# Patient Record
Sex: Female | Born: 1955 | Race: White | Hispanic: No | Smoking: Former smoker
Health system: Southern US, Community
[De-identification: ages and names within clinical notes are randomized; demographics above are authoritative.]

## PROBLEM LIST (undated history)

## (undated) DIAGNOSIS — I639 Cerebral infarction, unspecified: Secondary | ICD-10-CM

## (undated) DIAGNOSIS — N390 Urinary tract infection, site not specified: Secondary | ICD-10-CM

## (undated) DIAGNOSIS — M109 Gout, unspecified: Secondary | ICD-10-CM

## (undated) DIAGNOSIS — F0390 Unspecified dementia without behavioral disturbance: Secondary | ICD-10-CM

## (undated) DIAGNOSIS — G2581 Restless legs syndrome: Secondary | ICD-10-CM

## (undated) DIAGNOSIS — I10 Essential (primary) hypertension: Secondary | ICD-10-CM

## (undated) DIAGNOSIS — I609 Nontraumatic subarachnoid hemorrhage, unspecified: Secondary | ICD-10-CM

## (undated) DIAGNOSIS — F1011 Alcohol abuse, in remission: Secondary | ICD-10-CM

## (undated) DIAGNOSIS — I4891 Unspecified atrial fibrillation: Secondary | ICD-10-CM

## (undated) DIAGNOSIS — Z720 Tobacco use: Secondary | ICD-10-CM

## (undated) DIAGNOSIS — I482 Chronic atrial fibrillation, unspecified: Secondary | ICD-10-CM

## (undated) DIAGNOSIS — I671 Cerebral aneurysm, nonruptured: Secondary | ICD-10-CM

## (undated) HISTORY — PX: CEREBRAL ANEURYSM REPAIR: SHX164

---

## 2011-02-16 ENCOUNTER — Inpatient Hospital Stay: Payer: Self-pay | Admitting: Internal Medicine

## 2012-08-22 IMAGING — US US CAROTID DUPLEX BILAT
1 series · 17 of 24 positions shown · non-contrast
Comparison: none

REASON FOR EXAM: CVA
COMMENTS:

PROCEDURE:     US  - US CAROTID DOPPLER BILATERAL  - February 16, 2011  [DATE]
RESULT:
Grayscale, Duplex, color flow and SPECTRAL waveform imaging was performed of
the right and left carotid systems.

[Series 1: us carotid duplex bilat · 17 of 64 slices shown]
[im 1/64]
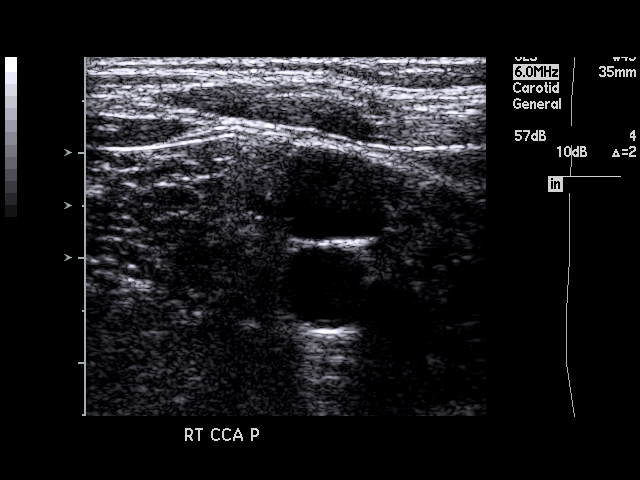
[im 6/64]
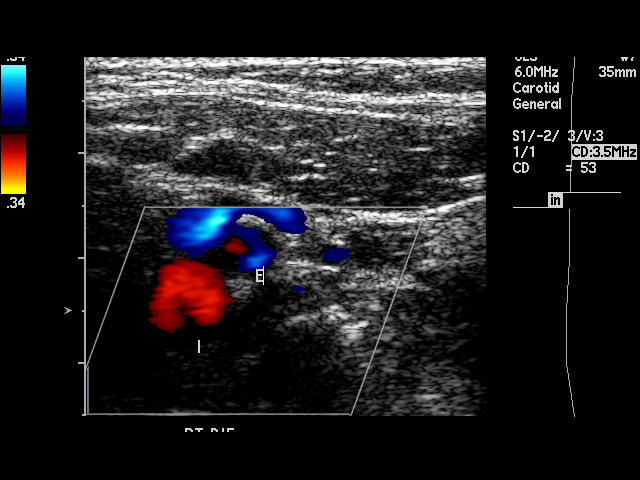
[im 9/64]
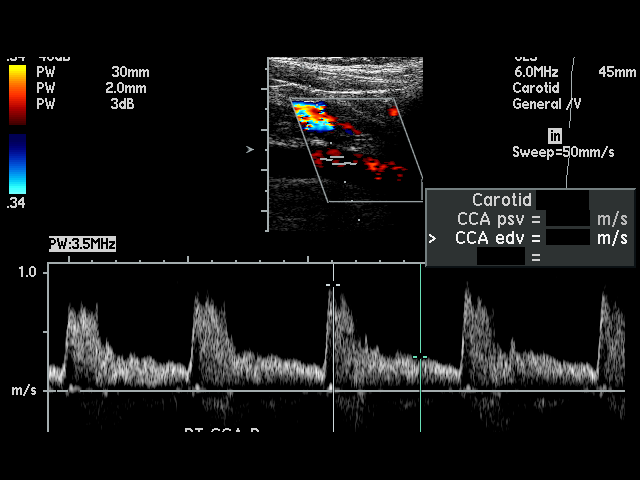
[im 11/64]
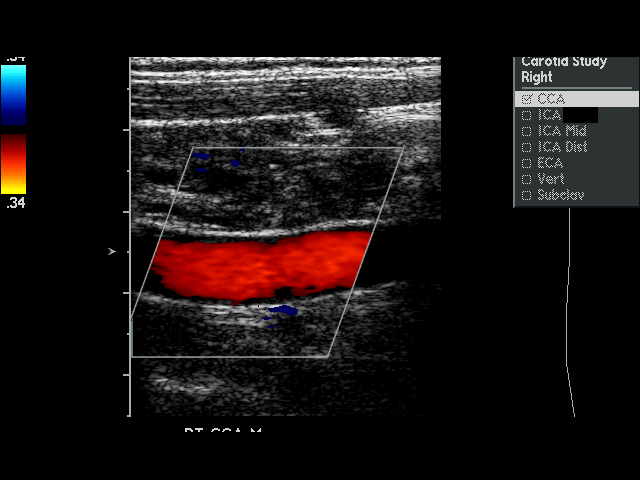
[im 17/64]
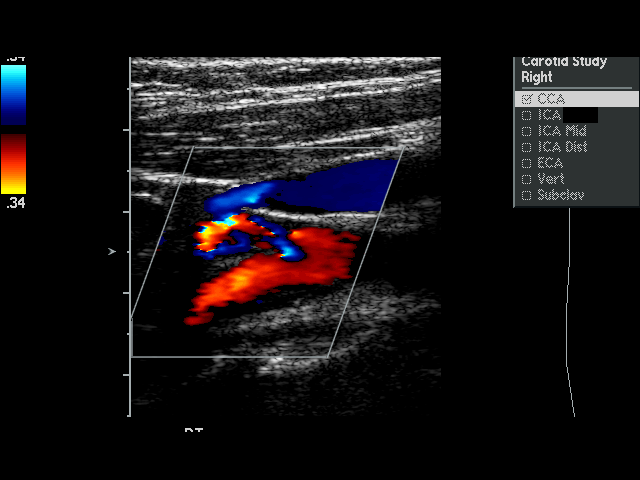
[im 20/64]
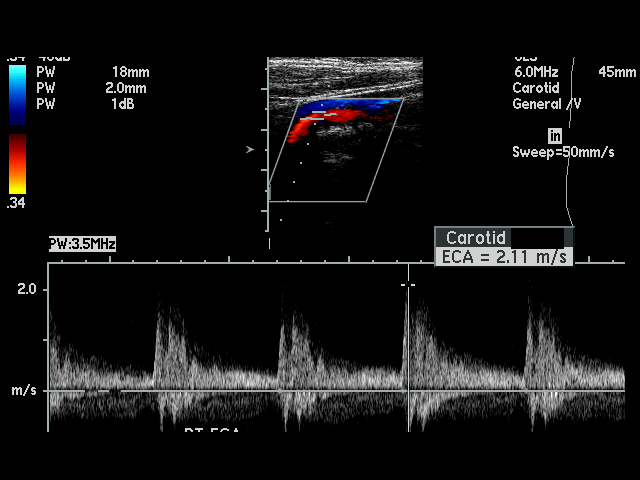
[im 25/64]
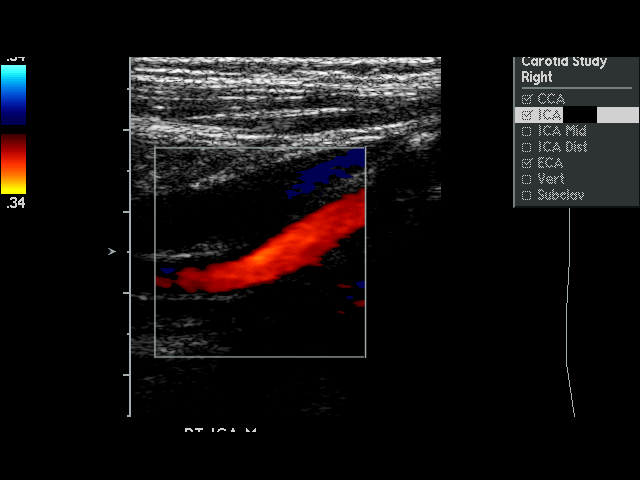
[im 28/64]
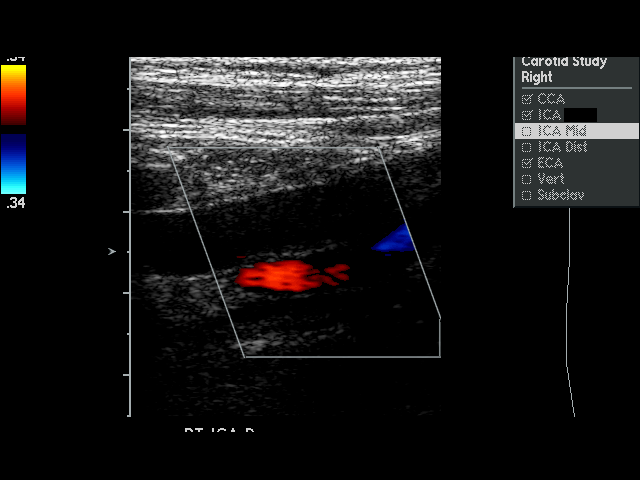
[im 33/64]
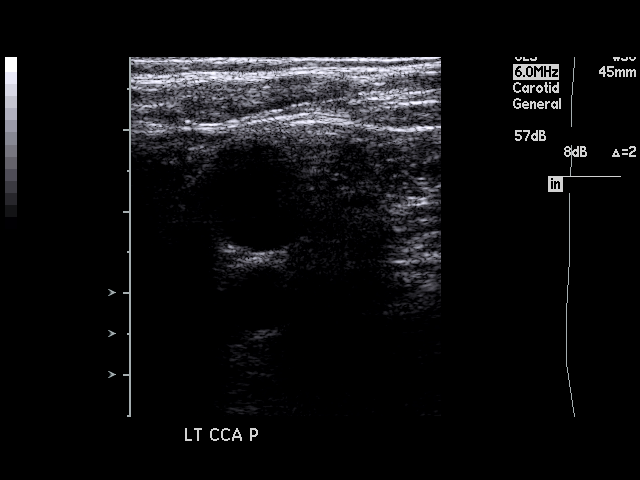
[im 36/64]
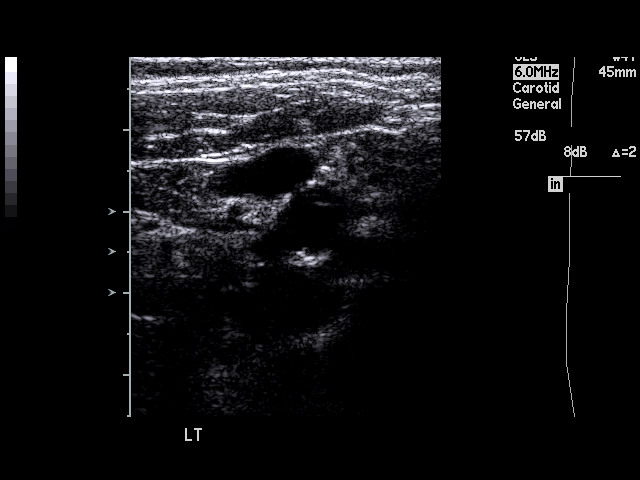
[im 39/64]
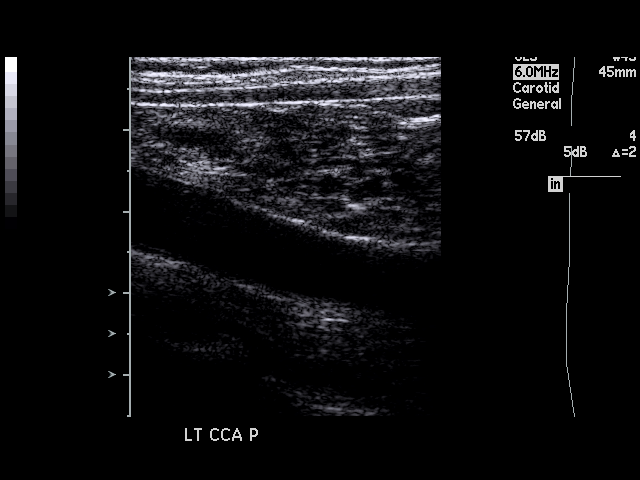
[im 44/64]
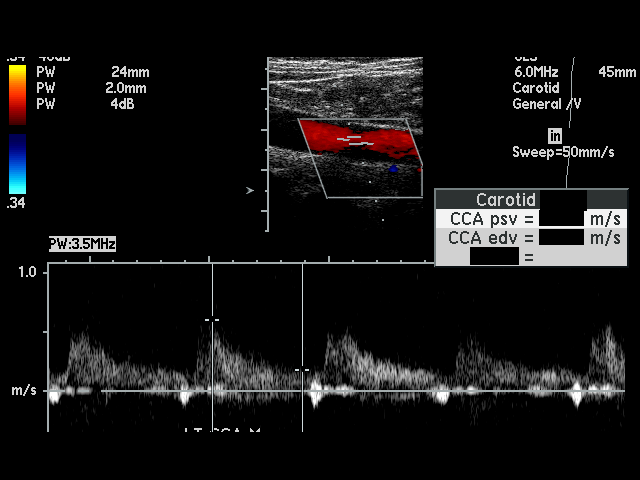
[im 47/64]
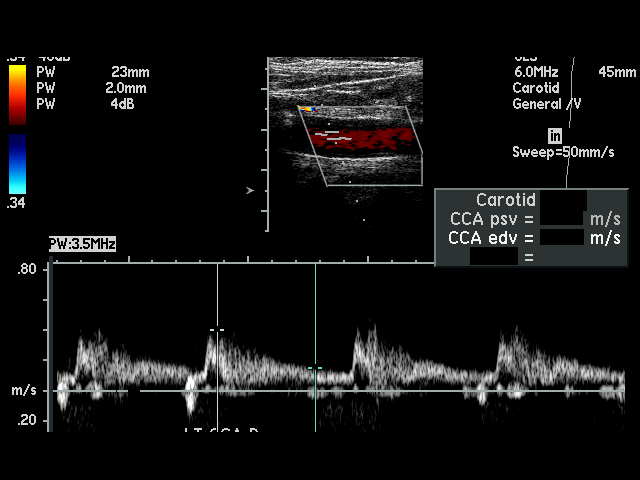
[im 53/64]
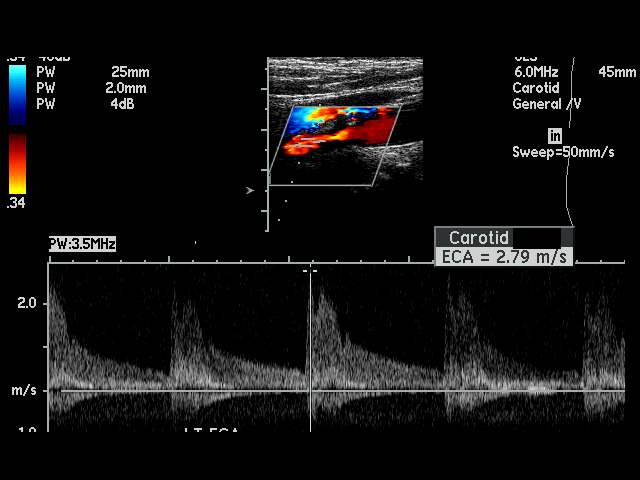
[im 55/64]
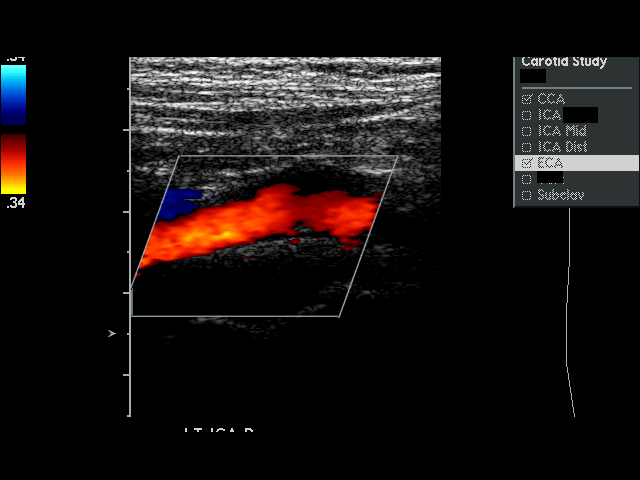
[im 58/64]
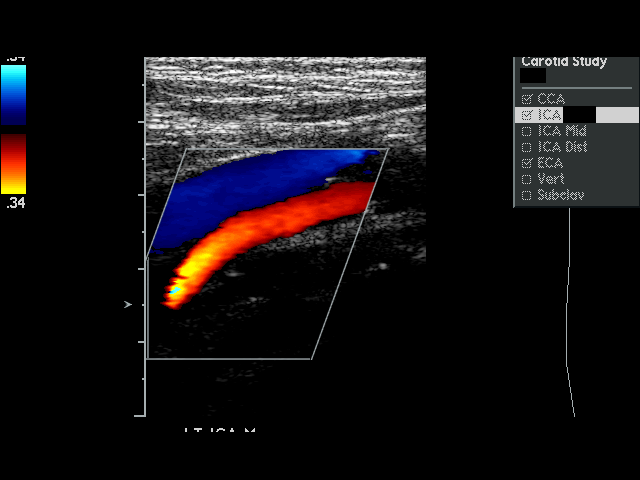
[im 64/64]
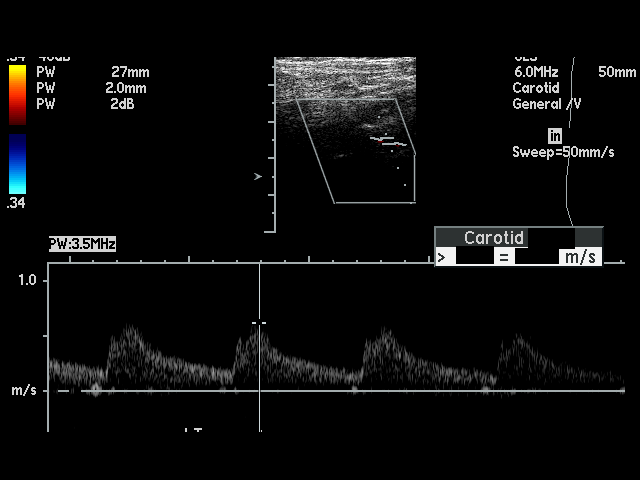

[17 of 24 positions shown; findings below may reference images not displayed]

FINDINGS: Visual evaluation of the right carotid system demonstrates mixed, calcified
and smooth plaque within the internal carotid artery, and the carotid bulb
extending into the common carotid artery. These areas demonstrate less than
50% visual stenosis.

Left asymmetric mixed, calcified and smooth plaque is appreciated within the
carotid bulb and common carotid artery on the right demonstrating less than
50% visual stenosis.

Color flow and SPECTRAL waveform imaging is unremarkable within the right
and left carotid systems.

ICA/CCA ratios:

Right:
Left:

Antegrade flow is identified within the right and left vertebral arteries.
IMPRESSION: No sonographic evidence of hemodynamically significance
stenosis within the right and left carotid systems.

## 2012-08-22 IMAGING — CT CT HEAD WITHOUT CONTRAST
2 series · 15 of 30 positions shown, 19 images · non-contrast
Comparison: none

REASON FOR EXAM: CVA
COMMENTS:   May transport without cardiac monitor

[Series 2: without · axial · non-contrast · 0.38mm/px · z∈[+879,+999]mm · 13 of 29 slices shown, 17 images]
[im 3/29  brain]
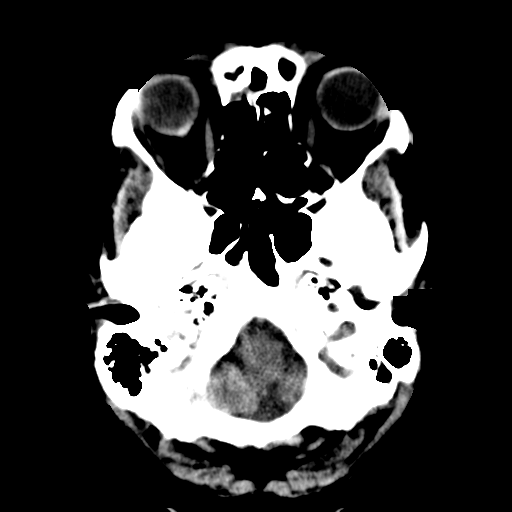
[im 3/29  bone]
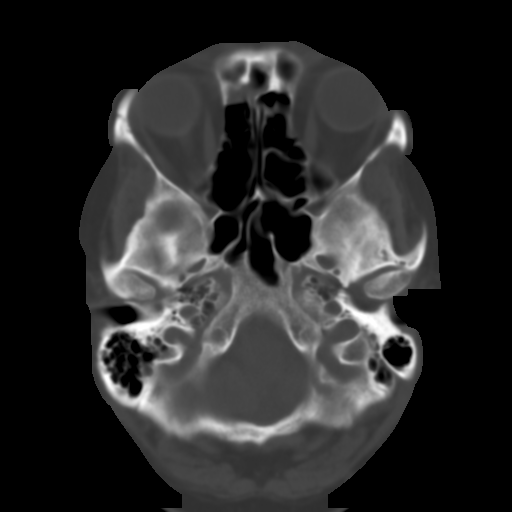
[im 5/29  brain]
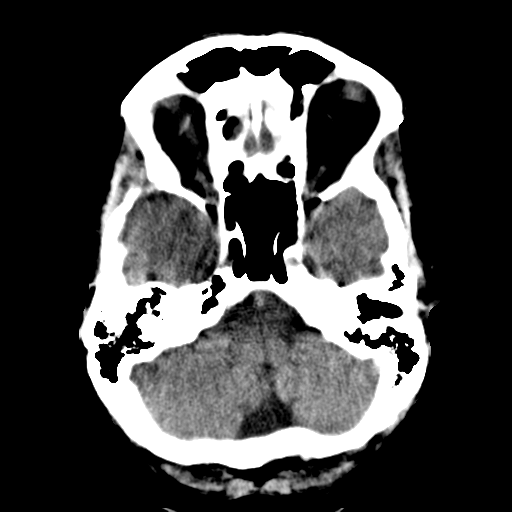
[im 7/29  brain]
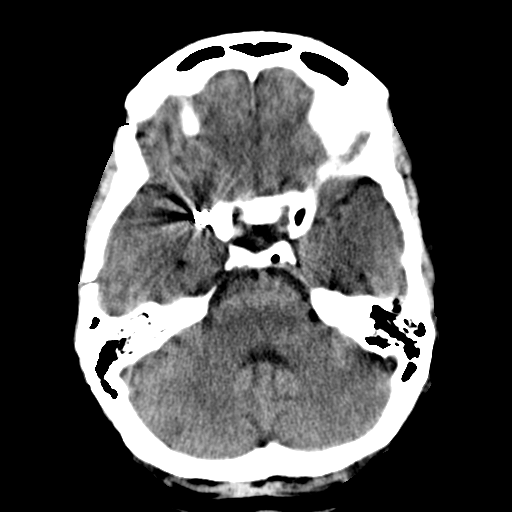
[im 9/29  brain]
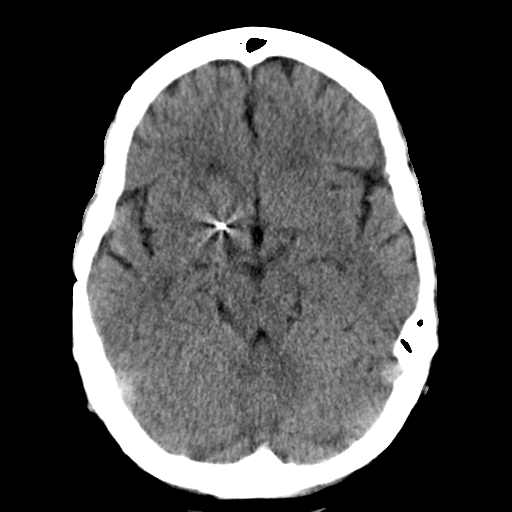
[im 11/29  brain]
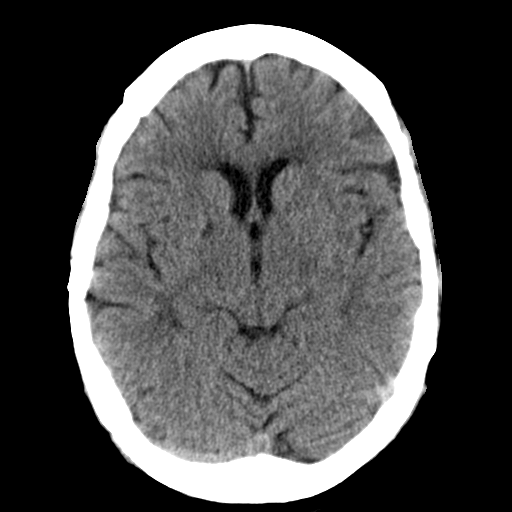
[im 11/29  bone]
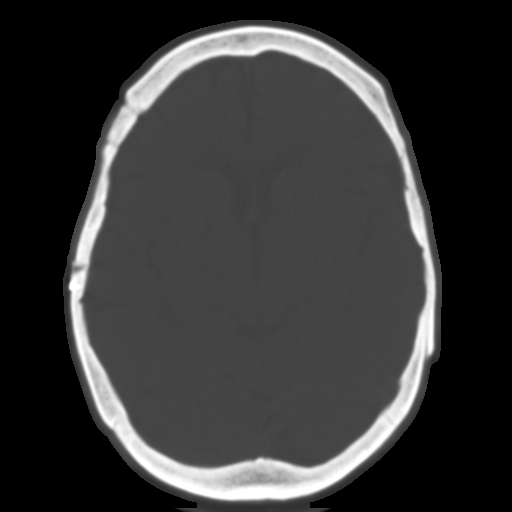
[im 13/29  brain]
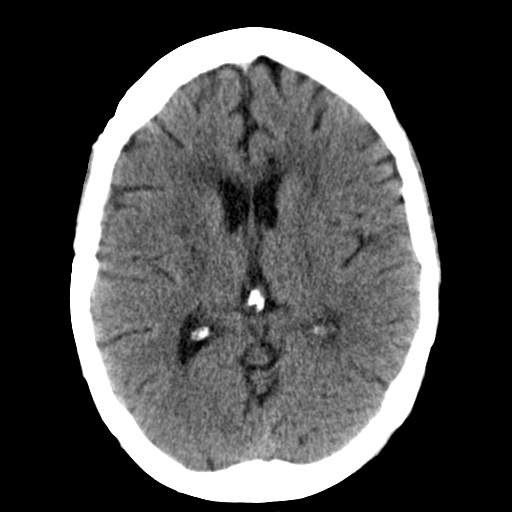
[im 15/29  brain]
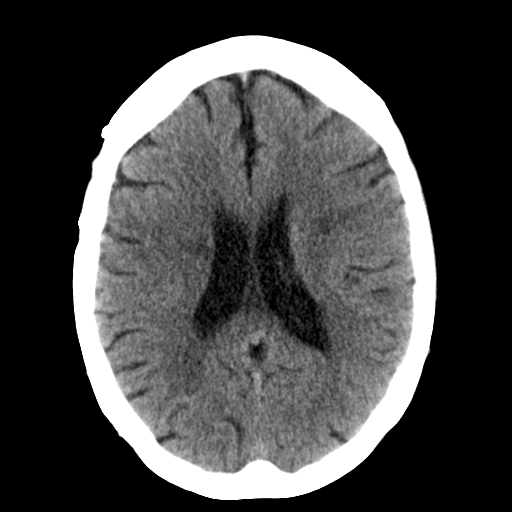
[im 17/29  brain]
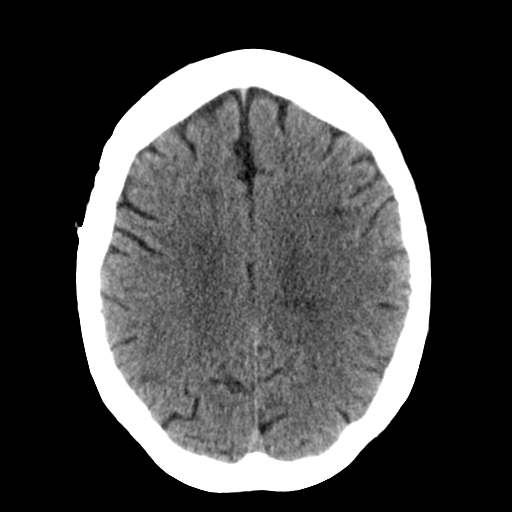
[im 19/29  brain]
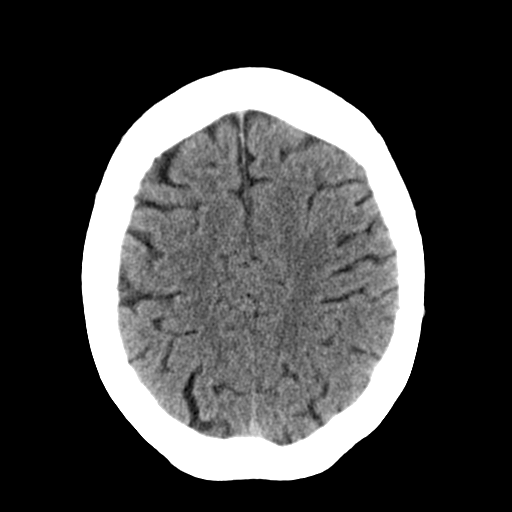
[im 19/29  bone]
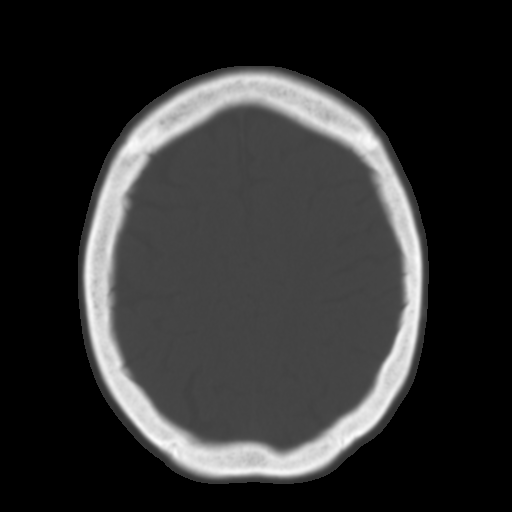
[im 21/29  brain]
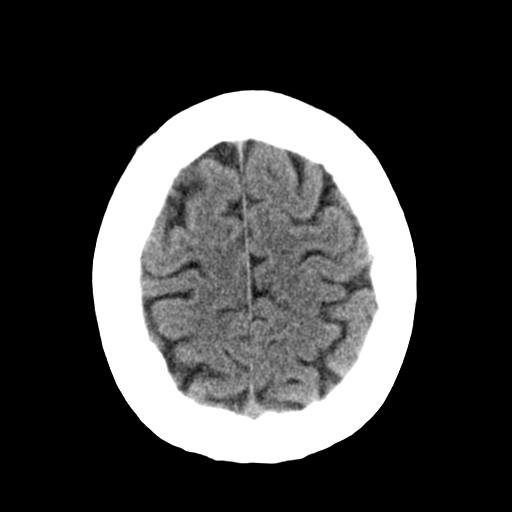
[im 23/29  brain]
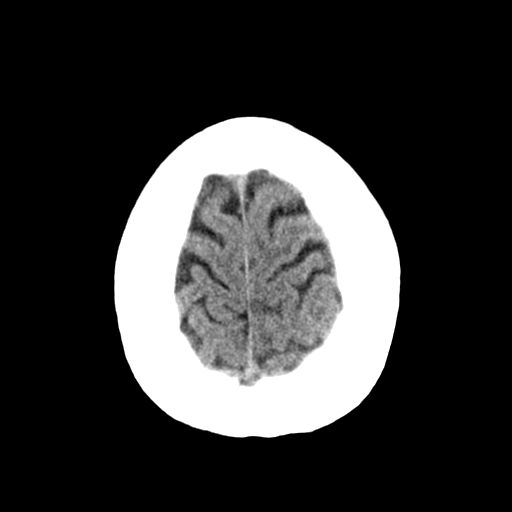
[im 25/29  brain]
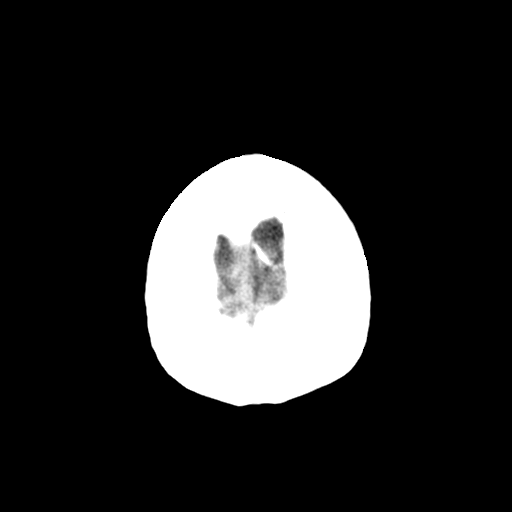
[im 27/29  brain]
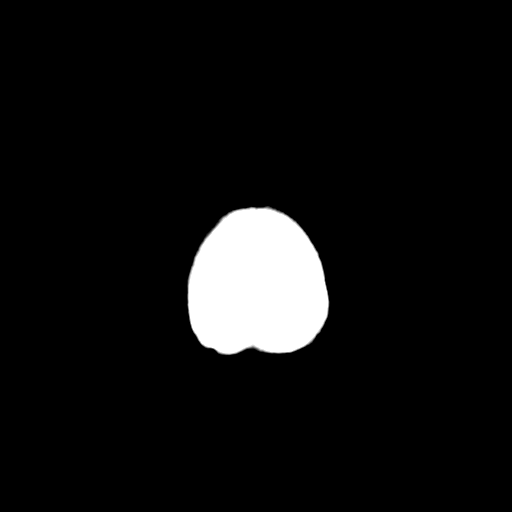
[im 27/29  bone]
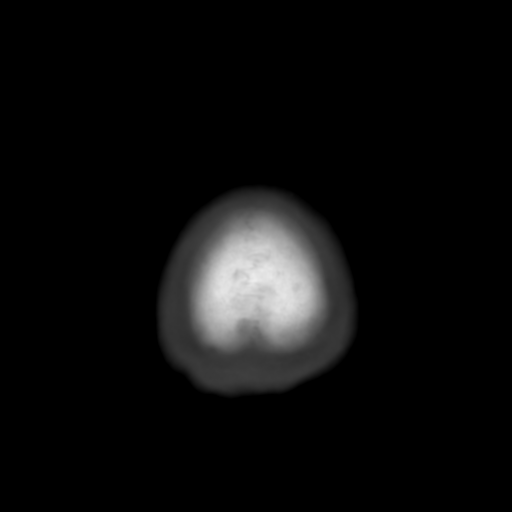

[Series 3: bone · axial · 0.38mm/px · z∈[+879,+899]mm · 2 of 29 slices shown]
[im 3/29  bone]
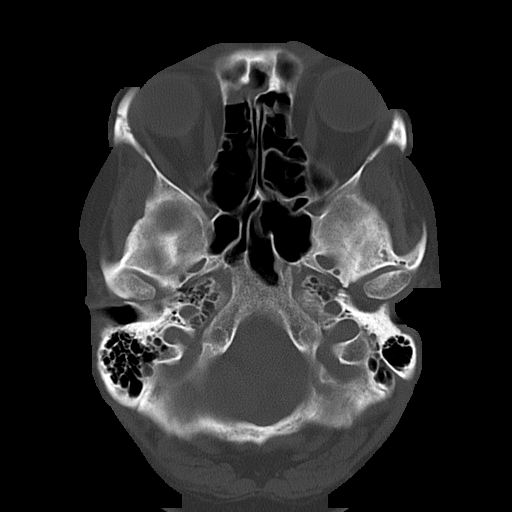
[im 7/29  bone]
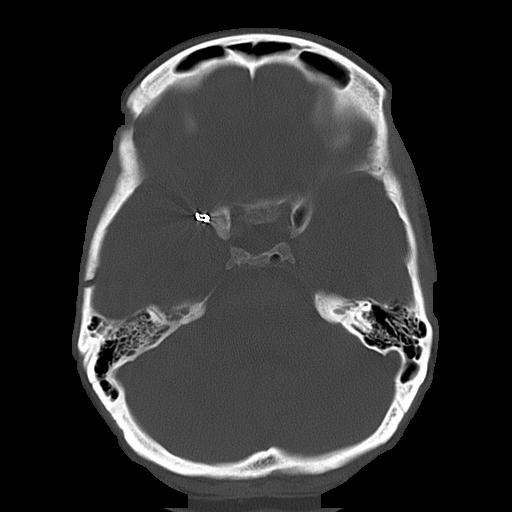

[15 of 30 positions shown; findings below may reference images not displayed]

PROCEDURE:     CT  - CT HEAD WITHOUT CONTRAST  - February 16, 2011  [DATE]

RESULT:     Emergent noncontrast CT of the brain is performed. There is no
previous similar study for comparison at this institution. There is an
aneurysm clip in the right parasellar region with craniotomy defect over the
right temporoparietal and frontal region. There is no definite intracranial
hemorrhage, mass effect or midline shift. There is some low attenuation
diffusely within the periventricular and subcortical white matter regions
most consistent with chronic small vessel ischemic disease. Some basal
ganglia lacunar infarcts are present bilaterally. The included sinuses and
mastoid air cells show normal appearing aeration.
IMPRESSION: 1. Right-sided aneurysm clip. Changes of a previous right-sided craniotomy.
Changes of atrophy and chronic small vessel ischemic disease. Basal ganglia
lacunar infarcts are present.
2. No acute intracranial abnormality evident.

## 2022-11-28 ENCOUNTER — Encounter: Payer: Self-pay | Admitting: Internal Medicine

## 2022-11-28 ENCOUNTER — Other Ambulatory Visit: Payer: Self-pay

## 2022-11-28 ENCOUNTER — Inpatient Hospital Stay: Payer: Medicare Other

## 2022-11-28 ENCOUNTER — Inpatient Hospital Stay
Admission: EM | Admit: 2022-11-28 | Discharge: 2022-12-02 | DRG: 872 | Disposition: A | Discharge status: Home / Self Care | Payer: Medicare Other | Attending: Hospitalist | Admitting: Hospitalist

## 2022-11-28 ENCOUNTER — Emergency Department: Payer: Self-pay

## 2022-11-28 DIAGNOSIS — I10 Essential (primary) hypertension: Secondary | ICD-10-CM | POA: Diagnosis not present

## 2022-11-28 DIAGNOSIS — I639 Cerebral infarction, unspecified: Secondary | ICD-10-CM | POA: Diagnosis present

## 2022-11-28 DIAGNOSIS — K838 Other specified diseases of biliary tract: Secondary | ICD-10-CM | POA: Diagnosis not present

## 2022-11-28 DIAGNOSIS — R160 Hepatomegaly, not elsewhere classified: Secondary | ICD-10-CM | POA: Diagnosis present

## 2022-11-28 DIAGNOSIS — I482 Chronic atrial fibrillation, unspecified: Secondary | ICD-10-CM | POA: Diagnosis not present

## 2022-11-28 DIAGNOSIS — F05 Delirium due to known physiological condition: Secondary | ICD-10-CM | POA: Diagnosis not present

## 2022-11-28 DIAGNOSIS — K8050 Calculus of bile duct without cholangitis or cholecystitis without obstruction: Principal | ICD-10-CM | POA: Diagnosis present

## 2022-11-28 DIAGNOSIS — M109 Gout, unspecified: Secondary | ICD-10-CM | POA: Diagnosis present

## 2022-11-28 DIAGNOSIS — R188 Other ascites: Secondary | ICD-10-CM | POA: Diagnosis present

## 2022-11-28 DIAGNOSIS — G2581 Restless legs syndrome: Secondary | ICD-10-CM | POA: Diagnosis present

## 2022-11-28 DIAGNOSIS — Z8673 Personal history of transient ischemic attack (TIA), and cerebral infarction without residual deficits: Secondary | ICD-10-CM

## 2022-11-28 DIAGNOSIS — D509 Iron deficiency anemia, unspecified: Secondary | ICD-10-CM | POA: Diagnosis not present

## 2022-11-28 DIAGNOSIS — J90 Pleural effusion, not elsewhere classified: Secondary | ICD-10-CM | POA: Diagnosis present

## 2022-11-28 DIAGNOSIS — R1013 Epigastric pain: Secondary | ICD-10-CM

## 2022-11-28 DIAGNOSIS — Z1152 Encounter for screening for COVID-19: Secondary | ICD-10-CM

## 2022-11-28 DIAGNOSIS — R791 Abnormal coagulation profile: Secondary | ICD-10-CM | POA: Diagnosis not present

## 2022-11-28 DIAGNOSIS — K829 Disease of gallbladder, unspecified: Secondary | ICD-10-CM | POA: Diagnosis present

## 2022-11-28 DIAGNOSIS — R911 Solitary pulmonary nodule: Secondary | ICD-10-CM | POA: Insufficient documentation

## 2022-11-28 DIAGNOSIS — K828 Other specified diseases of gallbladder: Secondary | ICD-10-CM

## 2022-11-28 DIAGNOSIS — R6520 Severe sepsis without septic shock: Secondary | ICD-10-CM | POA: Diagnosis present

## 2022-11-28 DIAGNOSIS — K8309 Other cholangitis: Secondary | ICD-10-CM | POA: Insufficient documentation

## 2022-11-28 DIAGNOSIS — D72829 Elevated white blood cell count, unspecified: Secondary | ICD-10-CM | POA: Insufficient documentation

## 2022-11-28 DIAGNOSIS — F17210 Nicotine dependence, cigarettes, uncomplicated: Secondary | ICD-10-CM | POA: Diagnosis present

## 2022-11-28 DIAGNOSIS — K8030 Calculus of bile duct with cholangitis, unspecified, without obstruction: Secondary | ICD-10-CM | POA: Diagnosis present

## 2022-11-28 DIAGNOSIS — E871 Hypo-osmolality and hyponatremia: Secondary | ICD-10-CM | POA: Diagnosis present

## 2022-11-28 DIAGNOSIS — N179 Acute kidney failure, unspecified: Secondary | ICD-10-CM | POA: Diagnosis present

## 2022-11-28 DIAGNOSIS — R17 Unspecified jaundice: Secondary | ICD-10-CM

## 2022-11-28 DIAGNOSIS — E876 Hypokalemia: Secondary | ICD-10-CM | POA: Diagnosis present

## 2022-11-28 DIAGNOSIS — I700 Atherosclerosis of aorta: Secondary | ICD-10-CM | POA: Diagnosis present

## 2022-11-28 DIAGNOSIS — Z720 Tobacco use: Secondary | ICD-10-CM | POA: Insufficient documentation

## 2022-11-28 DIAGNOSIS — Z7901 Long term (current) use of anticoagulants: Secondary | ICD-10-CM | POA: Diagnosis not present

## 2022-11-28 DIAGNOSIS — E860 Dehydration: Secondary | ICD-10-CM | POA: Diagnosis present

## 2022-11-28 DIAGNOSIS — R109 Unspecified abdominal pain: Secondary | ICD-10-CM | POA: Diagnosis present

## 2022-11-28 DIAGNOSIS — R7989 Other specified abnormal findings of blood chemistry: Secondary | ICD-10-CM | POA: Diagnosis present

## 2022-11-28 DIAGNOSIS — R748 Abnormal levels of other serum enzymes: Secondary | ICD-10-CM | POA: Insufficient documentation

## 2022-11-28 DIAGNOSIS — A419 Sepsis, unspecified organism: Principal | ICD-10-CM | POA: Diagnosis present

## 2022-11-28 DIAGNOSIS — Z8249 Family history of ischemic heart disease and other diseases of the circulatory system: Secondary | ICD-10-CM

## 2022-11-28 HISTORY — DX: Tobacco use: Z72.0

## 2022-11-28 HISTORY — DX: Cerebral infarction, unspecified: I63.9

## 2022-11-28 HISTORY — DX: Restless legs syndrome: G25.81

## 2022-11-28 HISTORY — DX: Gout, unspecified: M10.9

## 2022-11-28 HISTORY — DX: Urinary tract infection, site not specified: N39.0

## 2022-11-28 HISTORY — DX: Alcohol abuse, in remission: F10.11

## 2022-11-28 HISTORY — DX: Nontraumatic subarachnoid hemorrhage, unspecified: I60.9

## 2022-11-28 HISTORY — DX: Chronic atrial fibrillation, unspecified: I48.20

## 2022-11-28 HISTORY — DX: Cerebral aneurysm, nonruptured: I67.1

## 2022-11-28 HISTORY — DX: Essential (primary) hypertension: I10

## 2022-11-28 LAB — CBC
HCT: 33.2 % — ABNORMAL LOW (ref 36.0–46.0)
Hemoglobin: 10.2 g/dL — ABNORMAL LOW (ref 12.0–15.0)
MCH: 22 pg — ABNORMAL LOW (ref 26.0–34.0)
MCHC: 30.7 g/dL (ref 30.0–36.0)
MCV: 71.7 fL — ABNORMAL LOW (ref 80.0–100.0)
Platelets: 348 10*3/uL (ref 150–400)
RBC: 4.63 MIL/uL (ref 3.87–5.11)
RDW: 17.3 % — ABNORMAL HIGH (ref 11.5–15.5)
WBC: 30.5 10*3/uL — ABNORMAL HIGH (ref 4.0–10.5)
nRBC: 0 % (ref 0.0–0.2)

## 2022-11-28 LAB — RESP PANEL BY RT-PCR (RSV, FLU A&B, COVID)  RVPGX2
Influenza A by PCR: NEGATIVE
Influenza B by PCR: NEGATIVE
Resp Syncytial Virus by PCR: NEGATIVE
SARS Coronavirus 2 by RT PCR: NEGATIVE

## 2022-11-28 LAB — COMPREHENSIVE METABOLIC PANEL
ALT: 45 U/L — ABNORMAL HIGH (ref 0–44)
AST: 87 U/L — ABNORMAL HIGH (ref 15–41)
Albumin: 2.6 g/dL — ABNORMAL LOW (ref 3.5–5.0)
Alkaline Phosphatase: 345 U/L — ABNORMAL HIGH (ref 38–126)
Anion gap: 15 (ref 5–15)
BUN: 21 mg/dL (ref 8–23)
CO2: 21 mmol/L — ABNORMAL LOW (ref 22–32)
Calcium: 8.5 mg/dL — ABNORMAL LOW (ref 8.9–10.3)
Chloride: 97 mmol/L — ABNORMAL LOW (ref 98–111)
Creatinine, Ser: 1.68 mg/dL — ABNORMAL HIGH (ref 0.44–1.00)
GFR, Estimated: 33 mL/min — ABNORMAL LOW (ref >60)
Glucose, Bld: 136 mg/dL — ABNORMAL HIGH (ref 70–99)
Potassium: 2.9 mmol/L — ABNORMAL LOW (ref 3.5–5.1)
Sodium: 133 mmol/L — ABNORMAL LOW (ref 135–145)
Total Bilirubin: 6.4 mg/dL — ABNORMAL HIGH (ref 0.3–1.2)
Total Protein: 6.5 g/dL (ref 6.5–8.1)

## 2022-11-28 LAB — AMMONIA: Ammonia: 15 umol/L (ref 9–35)

## 2022-11-28 LAB — PROTIME-INR
INR: >10 (ref 0.8–1.2)
Prothrombin Time: >90 seconds — ABNORMAL HIGH (ref 11.4–15.2)

## 2022-11-28 LAB — LIPASE, BLOOD: Lipase: 32 U/L (ref 11–51)

## 2022-11-28 LAB — PROCALCITONIN: Procalcitonin: 27.49 ng/mL

## 2022-11-28 LAB — PHOSPHORUS: Phosphorus: 3.2 mg/dL (ref 2.5–4.6)

## 2022-11-28 LAB — MAGNESIUM: Magnesium: 1.5 mg/dL — ABNORMAL LOW (ref 1.7–2.4)

## 2022-11-28 LAB — APTT: aPTT: 88 seconds — ABNORMAL HIGH (ref 24–36)

## 2022-11-28 LAB — LACTIC ACID, PLASMA: Lactic Acid, Venous: 1.9 mmol/L (ref 0.5–1.9)

## 2022-11-28 MED ORDER — DIPHENHYDRAMINE HCL 50 MG/ML IJ SOLN
12.5000 mg | Freq: Three times a day (TID) | INTRAMUSCULAR | Status: DC | PRN
  Administered 2022-11-29: 12.5 mg via INTRAVENOUS
  Filled 2022-11-28: qty 1

## 2022-11-28 MED ORDER — PANTOPRAZOLE SODIUM 40 MG IV SOLR
40.0000 mg | Freq: Once | INTRAVENOUS | Status: AC
  Administered 2022-11-28: 40 mg via INTRAVENOUS
  Filled 2022-11-28: qty 10

## 2022-11-28 MED ORDER — NICOTINE 21 MG/24HR TD PT24
21.0000 mg | MEDICATED_PATCH | Freq: Every day | TRANSDERMAL | Status: DC
  Administered 2022-11-28 – 2022-12-02 (×5): 21 mg via TRANSDERMAL
  Filled 2022-11-28 (×6): qty 1

## 2022-11-28 MED ORDER — PIPERACILLIN-TAZOBACTAM 3.375 G IVPB 30 MIN: 3.3750 g | Freq: Once | INTRAVENOUS | Status: DC

## 2022-11-28 MED ORDER — KETOROLAC TROMETHAMINE 15 MG/ML IJ SOLN
15.0000 mg | Freq: Once | INTRAMUSCULAR | Status: AC
  Administered 2022-11-28: 15 mg via INTRAVENOUS
  Filled 2022-11-28: qty 1

## 2022-11-28 MED ORDER — ONDANSETRON HCL 4 MG/2ML IJ SOLN: 4.0000 mg | Freq: Three times a day (TID) | INTRAMUSCULAR | Status: DC | PRN

## 2022-11-28 MED ORDER — SODIUM CHLORIDE 0.9 % IV SOLN
2.0000 g | Freq: Once | INTRAVENOUS | Status: AC
  Administered 2022-11-28: 2 g via INTRAVENOUS
  Filled 2022-11-28: qty 20

## 2022-11-28 MED ORDER — VITAMIN K1 10 MG/ML IJ SOLN
5.0000 mg | Freq: Once | INTRAVENOUS | Status: AC
  Administered 2022-11-28: 5 mg via INTRAVENOUS
  Filled 2022-11-28: qty 0.5

## 2022-11-28 MED ORDER — SODIUM CHLORIDE 0.9 % IV BOLUS
1000.0000 mL | Freq: Once | INTRAVENOUS | Status: AC
  Administered 2022-11-28: 1000 mL via INTRAVENOUS

## 2022-11-28 MED ORDER — SODIUM CHLORIDE 0.9 % IV SOLN: INTRAVENOUS | Status: DC

## 2022-11-28 MED ORDER — MAGNESIUM SULFATE 2 GM/50ML IV SOLN
2.0000 g | Freq: Once | INTRAVENOUS | Status: AC
  Administered 2022-11-28: 2 g via INTRAVENOUS
  Filled 2022-11-28: qty 50

## 2022-11-28 MED ORDER — HYDRALAZINE HCL 20 MG/ML IJ SOLN
5.0000 mg | INTRAMUSCULAR | Status: DC | PRN
  Administered 2022-11-29: 5 mg via INTRAVENOUS
  Filled 2022-11-28: qty 1

## 2022-11-28 MED ORDER — METRONIDAZOLE 500 MG/100ML IV SOLN
500.0000 mg | Freq: Once | INTRAVENOUS | Status: AC
  Administered 2022-11-28: 500 mg via INTRAVENOUS
  Filled 2022-11-28: qty 100

## 2022-11-28 MED ORDER — POTASSIUM CHLORIDE CRYS ER 20 MEQ PO TBCR
40.0000 meq | EXTENDED_RELEASE_TABLET | ORAL | Status: AC
  Administered 2022-11-28 (×2): 40 meq via ORAL
  Filled 2022-11-28 (×3): qty 2

## 2022-11-28 MED ORDER — IOHEXOL 9 MG/ML PO SOLN
500.0000 mL | ORAL | Status: AC
  Administered 2022-11-28 (×2): 500 mL via ORAL

## 2022-11-28 MED ORDER — MORPHINE SULFATE (PF) 2 MG/ML IV SOLN: 1.0000 mg | INTRAVENOUS | Status: DC | PRN

## 2022-11-28 MED ORDER — PIPERACILLIN-TAZOBACTAM 3.375 G IVPB
3.3750 g | Freq: Three times a day (TID) | INTRAVENOUS | Status: DC
  Administered 2022-11-28 – 2022-12-02 (×11): 3.375 g via INTRAVENOUS
  Filled 2022-11-28 (×12): qty 50

## 2022-11-28 NOTE — ED Notes (Signed)
US at bedside

## 2022-11-28 NOTE — Consult Note (Signed)
Pharmacy Antibiotic Note  Ana Herrera is a 67 y.o. female admitted on 11/28/2022 with IAI. Pharmacy has been consulted for Zosyn dosing.  CrCl 28.4  Plan: Zosyn 3.375 gm IV Q8H  Height: '5\' 4"'$  (162.6 cm) Weight: 56.2 kg (124 lb) IBW/kg (Calculated) : 54.7  Temp (24hrs), Avg:98 F (36.7 C), Min:98 F (36.7 C), Max:98 F (36.7 C)  Recent Labs  Lab 11/28/22 1237 11/28/22 1438  WBC 30.5*  --   CREATININE 1.68*  --   LATICACIDVEN  --  1.9    Estimated Creatinine Clearance: 28.4 mL/min (A) (by C-G formula based on SCr of 1.68 mg/dL (H)).    No Known Allergies  Antimicrobials this admission: 2/25 Zosyn >>  2/25 ceftriaxone  x 1 dose 2/25 Metronidazole x 1 dose  Dose adjustments this admission: N/A  Microbiology results: 2/25 BCx: pending   Thank you for allowing pharmacy to be a part of this patient's care.  Alison Murray 11/28/2022 5:00 PM

## 2022-11-28 NOTE — ED Notes (Signed)
Dark green on ice also sent to lab at this time.

## 2022-11-28 NOTE — Consult Note (Signed)
Consultation  Referring Provider:  ED    Admit date: 11/28/2022 Consult date: 11/28/2022         Reason for Consultation:    Cholangitis 2/2 choledocholithiasis          HPI:   Ana Herrera is a 67 y.o. lady with history of cerebral aneurysm which required a clip, a. Fib on coumadin with last dose today, and hypertension here with 48 hours of severe abdominal pain with radiation to her back, chills, fatigue, and brain fog. Liver enzymes and ultrasound consistent with choledocholithiasis and clinical presentation consistent with moderate to severe cholangitis. She is alert and oriented x 3 but son notes some mental "slowing". She has never had any abdominal surgeries. No family history of significant GI issues. She smokes and a pack of cigarettes will last 3-4 days. Has history of alcohol use but none in 2 years. She rates generalized abdominal pain at about 8/10 which is an improvement in what it was. She denies fever but had chills yesterday. It looks like she had similar but more mild symptoms about one month ago and was treated empirically with augmentin but no labs or imaging was done at that time.  Past Medical History:  Diagnosis Date   Alcohol abuse, in remission    Cerebral aneurysm    Chronic atrial fibrillation (HCC)    Gout    HTN (hypertension)    RLS (restless legs syndrome)    SAH (subarachnoid hemorrhage) (HCC)    Stroke (HCC)    Tobacco abuse    UTI (urinary tract infection)     Past Surgical History:  Procedure Laterality Date   CEREBRAL ANEURYSM REPAIR     with clip per her son    Family History  Problem Relation Age of Onset   Hypertension Father    Hypertension Brother     Social History   Tobacco Use   Smoking status: Every Day    Types: Cigarettes   Smokeless tobacco: Never  Substance Use Topics   Alcohol use: Not Currently   Drug use: Never    Prior to Admission medications   Medication Sig Start Date End Date Taking? Authorizing Provider   diltiazem (CARDIZEM CD) 240 MG 24 hr capsule Take by mouth. 07/21/22 07/21/23 Yes [provider]  lisinopril (ZESTRIL) 20 MG tablet Take 20 mg by mouth daily. 07/21/22 07/21/23 Yes [provider]  warfarin (COUMADIN) 1 MG tablet Take 1 mg by mouth daily at 4 PM. 07/21/22 07/21/23 Yes [provider]  warfarin (COUMADIN) 3 MG tablet Take 3 mg by mouth daily at 4 PM. Sun,tues.thurs 07/21/22 07/21/23 Yes [provider]  diclofenac (VOLTAREN) 50 MG EC tablet Take 1 tablet by mouth daily. Patient not taking: Reported on 11/28/2022 06/17/21   [provider]    Current Facility-Administered Medications  Medication Dose Route Frequency Provider Last Rate Last Admin   0.9 %  sodium chloride infusion   Intravenous Continuous Ivor Costa, MD       diphenhydrAMINE (BENADRYL) injection 12.5 mg  12.5 mg Intravenous Q8H PRN Ivor Costa, MD       hydrALAZINE (APRESOLINE) injection 5 mg  5 mg Intravenous Q2H PRN Ivor Costa, MD       morphine (PF) 2 MG/ML injection 1 mg  1 mg Intravenous Q4H PRN Ivor Costa, MD       nicotine (NICODERM CQ - dosed in mg/24 hours) patch 21 mg  21 mg Transdermal Daily Ivor Costa, MD  potassium chloride SA (KLOR-CON M) CR tablet 40 mEq  40 mEq Oral Q4H Ivor Costa, MD       Current Outpatient Medications  Medication Sig Dispense Refill   diltiazem (CARDIZEM CD) 240 MG 24 hr capsule Take by mouth.     lisinopril (ZESTRIL) 20 MG tablet Take 20 mg by mouth daily.     warfarin (COUMADIN) 1 MG tablet Take 1 mg by mouth daily at 4 PM.     warfarin (COUMADIN) 3 MG tablet Take 3 mg by mouth daily at 4 PM. Sun,tues.thurs     diclofenac (VOLTAREN) 50 MG EC tablet Take 1 tablet by mouth daily. (Patient not taking: Reported on 11/28/2022)      Allergies as of 11/28/2022   (No Known Allergies)     Review of Systems:    All systems reviewed and negative except where noted in HPI.  Review of Systems  Constitutional:  Positive for chills  and malaise/fatigue. Negative for fever.  Respiratory:  Negative for shortness of breath.   Cardiovascular:  Negative for chest pain.  Gastrointestinal:  Positive for abdominal pain. Negative for blood in stool, constipation and melena.  Musculoskeletal:  Positive for back pain.  Skin:  Negative for rash.  Neurological:  Negative for focal weakness.  Psychiatric/Behavioral:  Negative for substance abuse.        Physical Exam:  Vital signs in last 24 hours: Temp:  [98 F (36.7 C)] 98 F (36.7 C) (02/25 1235) Pulse Rate:  [86-112] 92 (02/25 1515) Resp:  [16-24] 16 (02/25 1515) BP: (94-98)/(60-64) 96/62 (02/25 1430) SpO2:  [96 %-100 %] 99 % (02/25 1515) Weight:  [56.2 kg] 56.2 kg (02/25 1236)   General:   Pleasant in NAD Head:  Normocephalic and atraumatic. Eyes:   Mild scleral icterus.   Conjunctiva pink. Mouth: Mucosa pink moist, no lesions. Neck:  Supple; no masses felt Lungs:  No respiratory distress Abdomen:   Flat, soft, nondistended, mild tenderness Msk:  No clubbing or cyanosis. Strength 5/5. Symmetrical without gross deformities. Neurologic:  Alert and  oriented x4;  No focal deficits Skin:  Warm, dry, pink without significant lesions or rashes. Psych:  Alert and cooperative. Normal affect.  LAB RESULTS: Recent Labs    11/28/22 1237  WBC 30.5*  HGB 10.2*  HCT 33.2*  PLT 348   BMET Recent Labs    11/28/22 1237  NA 133*  K 2.9*  CL 97*  CO2 21*  GLUCOSE 136*  BUN 21  CREATININE 1.68*  CALCIUM 8.5*   LFT Recent Labs    11/28/22 1237  PROT 6.5  ALBUMIN 2.6*  AST 87*  ALT 45*  ALKPHOS 345*  BILITOT 6.4*   PT/INR Recent Labs    11/28/22 1237  LABPROT >90.0*  INR >10.0*    STUDIES: US ABDOMEN LIMITED RUQ (LIVER/GB)  Result Date: 11/28/2022 CLINICAL DATA:  Right upper quadrant pain.  Jaundice. EXAM: ULTRASOUND ABDOMEN LIMITED RIGHT UPPER QUADRANT COMPARISON:  None Available. FINDINGS: Technically challenging exam, patient had difficulty  remaining still. Gallbladder: The gallbladder wall is poorly defined. There is heterogeneous gallbladder contents some of which has shadowing likely representing intraluminal stones. Difficult to delineate the gallbladder wall from the adjacent hepatic parenchyma. No sonographic Murphy sign noted by sonographer. Common bile duct: Diameter: 11 mm. A stone is seen within the proximal duct measuring 5-6 mm. Liver: There is intrahepatic biliary ductal dilatation. No focal lesion identified. Slight increased echogenicity of the portal triads. Portal vein is patent on color  Doppler imaging with normal direction of blood flow towards the liver. Other: No right upper quadrant ascites. IMPRESSION: 1. Abnormal appearance of the gallbladder, heterogeneous gallbladder contents, possibly solid. Poorly defined wall that is indistinguishable from the adjacent liver parenchyma. Findings are suspicious for intraluminal mass. In addition there is some posterior shadowing suggestive of calcifications are stones. Recommend further assessment with MRI. 2. Intra and extrahepatic biliary ductal dilatation. 5-6 mm stone is seen within the proximal common bile duct. Electronically Signed   By: Keith Rake M.D.   On: 11/28/2022 15:40       Impression / Plan:   67 y/o lady with history of cerebral aneurysm which required a clip, a. Fib on coumadin with last dose today, and hypertension here with 48 hours of severe abdominal pain with radiation to her back, chills, fatigue, and brain fog consistent with moderate to severe cholangitis from choledocholithiasis seen on ultrasound. Possible solid mass in gallbladder so further imaging is pending  - IV antibiotics either with zosyn or ceftriaxone and flagyl - IVF - agree with IV vitamin k, will need to recheck INR in the morning. Would also consider [facility] - will tentatively plan ERCP tomorrow - f/u on MRI (if able giving brain clips) vs CT imaging - labs tomorrow - pain control -  monitor for any worsening mental status changes - replete electrolytes aggressively  Will continue to follow, please call with any questions or concerns.  Raylene Miyamoto MD, MPH [place]

## 2022-11-28 NOTE — ED Triage Notes (Signed)
Pt via POV from home.Pt c/o confusion and AMS. Per son, pt looks jaundice. Denies any dx of liver issues but has been a chronic drinker for the past 40 years but has been sober the last 2 years. AMS started 24-48 hour. Pt has been having abd pain also for the past couple of weeks. Pt was dx with a UTI approx 2-3 weeks ago. Pt has a hx of afib, stroke. Pt on warfarin. Pt is A&OX4 and NAD

## 2022-11-28 NOTE — H&P (Addendum)
History and Physical    Ana Herrera [id number] DOB: 01/02/1956 DOA: 11/28/2022  Referring MD/NP/PA:   PCP: Valera Castle, MD   Patient coming from:  The patient is coming from home.   Chief Complaint: Abdominal pain, altered mental status  HPI: Ana Herrera is a 67 y.o. female with medical history significant of hypertension, stroke without deficit, gout, tobacco abuse, alcohol abuse in remission, atrial fibrillation on Coumadin, RLS, SAH, UTI, brain aneurysm 1.9, with clip repair per her son), who presents with abdominal pain and altered mental status.  Per her son at the bedside, patient has abdominal pain for almost a month.  Patient was recently treated for UTI with Augmentin. Her UTI symptoms have resolved. Pt continues to have intermittent abdominal pain, which has worsened in the past several days.  The abdominal pain is located in the upper abdomen, aching, mild to moderate, radiating to the back.  No nausea, vomiting, diarrhea.  No fever or chills.  Patient has jaundice with yellow eye.  No chest pain, cough, shortness of breath. Patient has confusion in the past several days. No fall. When I saw patient in ED, patient is mildly confused, still orientated x 3.  Patient moves all extremities normally.  No facial droop or slurred speech.  No active bleeding or dark stool.  Data reviewed independently and ED Course: pt was found to have WBC 30.5, lactic acid 1.9, abnormal liver function (ALP 345, AST 87, ALT 45, total protein 6.4), ammonia 15, INR> 10, lipase 32, AKI with creatinine 1.68, BUN 21, GFR 33 (baseline creatinine 0.7 on 05/21/2019), temperature normal, blood pressure 96/62, heart rate 112, 92, RR 24, oxygen saturation 99% on room air. Pt is admitted to telemetry bed as inpatient.  Dr. Haig Prophet for GI is consulted.  RUQ [place]: 1. Abnormal appearance of the gallbladder, heterogeneous gallbladder contents, possibly solid. Poorly defined wall that  is indistinguishable from the adjacent liver parenchyma. Findings are suspicious for intraluminal mass. In addition there is some posterior shadowing suggestive of calcifications are stones. Recommend further assessment with MRI. 2. Intra and extrahepatic biliary ductal dilatation. 5-6 mm stone is seen within the proximal common bile duct.   EKG: I have personally reviewed.  Atrial fibrillation, QTc 502, poor R wave progression   Review of Systems:   General: no fevers, chills, no body weight gain, fatigue HEENT: no blurry vision, hearing changes or sore throat Respiratory: no dyspnea, coughing, wheezing CV: no chest pain, no palpitations GI: no nausea, vomiting, has abdominal pain, no diarrhea, constipation GU: no dysuria, burning on urination, increased urinary frequency, hematuria  Ext: no leg edema Neuro: no unilateral weakness, numbness, or tingling, no vision change or hearing loss. Has confusion Skin: no rash, no skin tear. MSK: No muscle spasm, no deformity, no limitation of range of movement in spin Heme: No easy bruising.  Travel history: No recent long distant travel.   Allergy: No Known Allergies  Past Medical History:  Diagnosis Date   Alcohol abuse, in remission    Cerebral aneurysm    Chronic atrial fibrillation (HCC)    Gout    HTN (hypertension)    RLS (restless legs syndrome)    SAH (subarachnoid hemorrhage) (HCC)    Stroke (HCC)    Tobacco abuse    UTI (urinary tract infection)     Past Surgical History:  Procedure Laterality Date   CEREBRAL ANEURYSM REPAIR     with clip per her son    Social History:  reports that she has been smoking cigarettes. She has never used smokeless tobacco. She reports that she does not currently use alcohol. She reports that she does not use drugs.  Family History:  Family History  Problem Relation Age of Onset   Hypertension Father    Hypertension Brother      Prior to Admission medications   Not on File     Physical Exam: Vitals:   11/28/22 1400 11/28/22 1430 11/28/22 1515 11/28/22 1822  BP: 98/64 96/62  95/60  Pulse: 86  92 81  Resp: (!) 21 (!) '24 16 20  '$ Temp:    98 F (36.7 C)  TempSrc:      SpO2: 100% 98% 99% 99%  Weight:      Height:       General: Not in acute distress HEENT:       Eyes: PERRL, EOMI,  has scleral icterus.       ENT: No discharge from the ears and nose, no pharynx injection, no tonsillar enlargement.        Neck: No JVD, no bruit, no mass felt. Heme: No neck lymph node enlargement. Cardiac: S1/S2, RRR, No murmurs, No gallops or rubs. Respiratory: No rales, wheezing, rhonchi or rubs. GI: Soft, nondistended, has upper abdominal tenderness, no rebound pain, no organomegaly, BS present. GU: No hematuria Ext: No pitting leg edema bilaterally. 1+DP/PT pulse bilaterally. Musculoskeletal: No joint deformities, No joint redness or warmth, no limitation of ROM in spin. Skin: No rashes.  Neuro: Mildly confused, following command, oriented X3, cranial nerves II-XII grossly intact, moves all extremities Psych: Patient is not psychotic, no suicidal or hemocidal ideation.  Labs on Admission: I have personally reviewed following labs and imaging studies  CBC: Recent Labs  Lab 11/28/22 1237  WBC 30.5*  HGB 10.2*  HCT 33.2*  MCV 71.7*  PLT 0000000   Basic Metabolic Panel: Recent Labs  Lab 11/28/22 1237  NA 133*  K 2.9*  CL 97*  CO2 21*  GLUCOSE 136*  BUN 21  CREATININE 1.68*  CALCIUM 8.5*  MG 1.5*  PHOS 3.2   GFR: Estimated Creatinine Clearance: 28.4 mL/min (A) (by C-G formula based on SCr of 1.68 mg/dL (H)). Liver Function Tests: Recent Labs  Lab 11/28/22 1237  AST 87*  ALT 45*  ALKPHOS 345*  BILITOT 6.4*  PROT 6.5  ALBUMIN 2.6*   Recent Labs  Lab 11/28/22 1237  LIPASE 32   Recent Labs  Lab 11/28/22 1438  AMMONIA 15   Coagulation Profile: Recent Labs  Lab 11/28/22 1237  INR >10.0*   Cardiac Enzymes: No results for input(s):  "CKTOTAL", "CKMB", "CKMBINDEX", "TROPONINI" in the last 168 hours. BNP (last 3 results) No results for input(s): "PROBNP" in the last 8760 hours. HbA1C: No results for input(s): "HGBA1C" in the last 72 hours. CBG: No results for input(s): "GLUCAP" in the last 168 hours. Lipid Profile: No results for input(s): "CHOL", "HDL", "LDLCALC", "TRIG", "CHOLHDL", "LDLDIRECT" in the last 72 hours. Thyroid Function Tests: No results for input(s): "TSH", "T4TOTAL", "FREET4", "T3FREE", "THYROIDAB" in the last 72 hours. Anemia Panel: No results for input(s): "VITAMINB12", "FOLATE", "FERRITIN", "TIBC", "IRON", "RETICCTPCT" in the last 72 hours. Urine analysis: No results found for: "COLORURINE", "APPEARANCEUR", "LABSPEC", "PHURINE", "GLUCOSEU", "HGBUR", "BILIRUBINUR", "KETONESUR", "PROTEINUR", "UROBILINOGEN", "NITRITE", "LEUKOCYTESUR" Sepsis Labs: '@LABRCNTIP'$ (procalcitonin:4,lacticidven:4) ) Recent Results (from the past 240 hour(s))  Culture, blood (routine x 2)     Status: None (Preliminary result)   Collection Time: 11/28/22  2:38 PM   Specimen:  BLOOD  Result Value Ref Range Status   Specimen Description BLOOD LEFT ANTECUBITAL  Final   Special Requests   Final    BOTTLES DRAWN AEROBIC AND ANAEROBIC Blood Culture adequate volume   Culture   Final    NO GROWTH < 12 HOURS Performed at [facility], [address], [place], [place] [address]    Report Status PENDING  Incomplete  Culture, blood (routine x 2)     Status: None (Preliminary result)   Collection Time: 11/28/22  2:38 PM   Specimen: BLOOD  Result Value Ref Range Status   Specimen Description BLOOD RIGHT ANTECUBITAL  Final   Special Requests   Final    BOTTLES DRAWN AEROBIC AND ANAEROBIC Blood Culture adequate volume   Culture   Final    NO GROWTH < 12 HOURS Performed at [facility], [address], [place], [place] [address]    Report Status PENDING  Incomplete  Resp panel by RT-PCR (RSV, Flu A&B, Covid)  Anterior Nasal Swab     Status: None   Collection Time: 11/28/22  4:54 PM   Specimen: Anterior Nasal Swab  Result Value Ref Range Status   SARS Coronavirus 2 by RT PCR NEGATIVE NEGATIVE Final    Comment: (NOTE) SARS-CoV-2 target nucleic acids are NOT DETECTED.  The SARS-CoV-2 RNA is generally detectable in upper respiratory specimens during the acute phase of infection. The lowest concentration of SARS-CoV-2 viral copies this assay can detect is 138 copies/mL. A negative result does not preclude SARS-Cov-2 infection and should not be used as the sole basis for treatment or other patient management decisions. A negative result may occur with  improper specimen collection/handling, submission of specimen other than nasopharyngeal swab, presence of viral mutation(s) within the areas targeted by this assay, and inadequate number of viral copies(<138 copies/mL). A negative result must be combined with clinical observations, patient history, and epidemiological information. The expected result is Negative.  Fact Sheet for Patients:  EntrepreneurPulse.com.au  Fact Sheet for Healthcare Providers:  IncredibleEmployment.be  This test is no t yet approved or cleared by the [place] FDA and  has been authorized for detection and/or diagnosis of SARS-CoV-2 by FDA under an Emergency Use Authorization (EUA). This EUA will remain  in effect (meaning this test can be used) for the duration of the COVID-19 declaration under Section 564(b)(1) of the Act, 21 U.S.C.section 360bbb-3(b)(1), unless the authorization is terminated  or revoked sooner.       Influenza A by PCR NEGATIVE NEGATIVE Final   Influenza B by PCR NEGATIVE NEGATIVE Final    Comment: (NOTE) The Xpert Xpress SARS-CoV-2/FLU/RSV plus assay is intended as an aid in the diagnosis of influenza from Nasopharyngeal swab specimens and should not be used as a sole basis for treatment. Nasal washings  and aspirates are unacceptable for Xpert Xpress SARS-CoV-2/FLU/RSV testing.  Fact Sheet for Patients: EntrepreneurPulse.com.au  Fact Sheet for Healthcare Providers: IncredibleEmployment.be  This test is not yet approved or cleared by the [place] FDA and has been authorized for detection and/or diagnosis of SARS-CoV-2 by FDA under an Emergency Use Authorization (EUA). This EUA will remain in effect (meaning this test can be used) for the duration of the COVID-19 declaration under Section 564(b)(1) of the Act, 21 U.S.C. section 360bbb-3(b)(1), unless the authorization is terminated or revoked.     Resp Syncytial Virus by PCR NEGATIVE NEGATIVE Final    Comment: (NOTE) Fact Sheet for Patients: EntrepreneurPulse.com.au  Fact Sheet for Healthcare Providers:  IncredibleEmployment.be  This test is not yet approved or cleared by the [place] and has been authorized for detection and/or diagnosis of SARS-CoV-2 by FDA under an Emergency Use Authorization (EUA). This EUA will remain in effect (meaning this test can be used) for the duration of the COVID-19 declaration under Section 564(b)(1) of the Act, 21 U.S.C. section 360bbb-3(b)(1), unless the authorization is terminated or revoked.  Performed at [facility], [address] [place], [place] [address]      Radiological Exams on Admission: CT HEAD WO CONTRAST (5MM)  Result Date: 11/28/2022 CLINICAL DATA:  Mental status change, unknown cause Confusion. EXAM: CT HEAD WITHOUT CONTRAST TECHNIQUE: Contiguous axial images were obtained from the base of the skull through the vertex without intravenous contrast. RADIATION DOSE REDUCTION: This exam was performed according to the departmental dose-optimization program which includes automated exposure control, adjustment of the mA and/or kV according to patient size and/or use of iterative  reconstruction technique. COMPARISON:  None Available. FINDINGS: Brain: No acute hemorrhage, evidence of ischemia or subdural/extra-axial collection. Brain volume is normal, no hydrocephalus. Moderate to advanced periventricular and deep white matter hypodensity typical of chronic small vessel ischemic change. Small amount of encephalomalacia adjacent to right-sided aneurysm clip. Remote lacunar infarcts in the right basal ganglia and left caudate. Vascular: Right-sided aneurysm clip. Skull base atherosclerosis. No hyperdense vessel. Skull: Prior right-sided craniotomy.  No fracture or focal lesion. Sinuses/Orbits: Scattered mucosal thickening of ethmoid air cells. Left-sided sphenoid sinus retained secretions. No mastoid effusion. Other: None. IMPRESSION: 1. No acute intracranial abnormality. 2. Prior right-sided craniotomy with aneurysm clip in place. 3. Moderate to advanced chronic small vessel ischemic change. Remote lacunar infarcts in the right basal ganglia and left caudate. Electronically Signed   By: Keith Rake M.D.   On: 11/28/2022 16:54   US ABDOMEN LIMITED RUQ (LIVER/GB)  Result Date: 11/28/2022 CLINICAL DATA:  Right upper quadrant pain.  Jaundice. EXAM: ULTRASOUND ABDOMEN LIMITED RIGHT UPPER QUADRANT COMPARISON:  None Available. FINDINGS: Technically challenging exam, patient had difficulty remaining still. Gallbladder: The gallbladder wall is poorly defined. There is heterogeneous gallbladder contents some of which has shadowing likely representing intraluminal stones. Difficult to delineate the gallbladder wall from the adjacent hepatic parenchyma. No sonographic Murphy sign noted by sonographer. Common bile duct: Diameter: 11 mm. A stone is seen within the proximal duct measuring 5-6 mm. Liver: There is intrahepatic biliary ductal dilatation. No focal lesion identified. Slight increased echogenicity of the portal triads. Portal vein is patent on color Doppler imaging with normal direction  of blood flow towards the liver. Other: No right upper quadrant ascites. IMPRESSION: 1. Abnormal appearance of the gallbladder, heterogeneous gallbladder contents, possibly solid. Poorly defined wall that is indistinguishable from the adjacent liver parenchyma. Findings are suspicious for intraluminal mass. In addition there is some posterior shadowing suggestive of calcifications are stones. Recommend further assessment with MRI. 2. Intra and extrahepatic biliary ductal dilatation. 5-6 mm stone is seen within the proximal common bile duct. Electronically Signed   By: Keith Rake M.D.   On: 11/28/2022 15:40      Assessment/Plan Principal Problem:   Abdominal pain Active Problems:   Choledocholithiasis   Abnormal LFTs   HTN (hypertension)   AKI (acute kidney injury) (HCC)   Stroke (HCC)   Atrial fibrillation, chronic (HCC)   Supratherapeutic INR   Microcytic anemia   Hypokalemia   Hypomagnesemia   Assessment and Plan:  Abdominal pain and possible choledocholithiasis and gallbladder mass with abnormal LFTs:  RUQ-US showed abnormal appearance of the gallbladder, heterogeneous gallbladder contents, suspicious for intraluminal mass, also gall stones, with intra and extrahepatic biliary ductal dilatation. 5-6 mm stone is within the proximal common bile duct.  Patient has leukocytosis with WBC 30.5, indicating possible complicated with infection.  Will start antibiotics.  Initially MRCP is ordered, but patient has history of cerebral aneurysm with clip in place, patient cannot do MRCP per MRI. Will get CT-abd/pelvis. Consulted Dr. Haig Prophet of GI.   -will admitted to telemetry bed as inpatient -IV Zosyn (patient received 1 dose of Rocephin and Flagyl in ED) -Blood culture -IV fluid: 2 L normal saline, then 100 cc/h -As needed morphine for pain -As needed Benadryl for nausea vomiting (patient cannot use Zofran due to QTc prolonged patient) -CT-abd/pelvis without contrast due to AKI  HTN  (hypertension): Blood pressure is soft 96/62 -Hold lisinopril -Continue Cardizem which is for atrial fibrillation -IV hydralazine as needed  AKI (acute kidney injury) ([place]): Likely due to dehydration and continuation of NSAID and lisinopril -Hold Voltaren and lisinopril -IV fluid as above  History of stroke (HCC) -Hold warfarin due to supratherapeutic INR  Atrial fibrillation, chronic ([place]): heart rate 112 --> 92.  Patient is on Coumadin -Hold Coumadin due to supratherapeutic INR, and requiring ERCP procedure -Cardizem  Supratherapeutic INR: INR> 10, no active bleeding -Patient received 5 mg of vitamin K -Recheck INR pneumonia  Microcytic anemia: Hemoglobin 10.2 (11.0 on 03/31/2017) -Follow-up with CBC  Hypokalemia and hypomagnesemia: Potassium 2.9, magnesium 1.5.  Phosphorus 3.2 -Repleted both potassium and magnesium       DVT ppx: SCD  Code Status: Full code  Family Communication:   Yes, patient's son   at bed side.   Disposition Plan:  Anticipate discharge back to previous environment  Consults called:  Dr. Haig Prophet of GI  Admission status and Level of care: Telemetry Medical:  as inpt       Dispo: The patient is from: Home              Anticipated d/c is to: Home              Anticipated d/c date is: 2 days              Patient currently is not medically stable to d/c.    Severity of Illness:  The appropriate patient status for this patient is INPATIENT. Inpatient status is judged to be reasonable and necessary in order to provide the required intensity of service to ensure the patient's safety. The patient's presenting symptoms, physical exam findings, and initial radiographic and laboratory data in the context of their chronic comorbidities is felt to place them at high risk for further clinical deterioration. Furthermore, it is not anticipated that the patient will be medically stable for discharge from the hospital within 2 midnights of admission.   * I  certify that at the point of admission it is my clinical judgment that the patient will require inpatient hospital care spanning beyond 2 midnights from the point of admission due to high intensity of service, high risk for further deterioration and high frequency of surveillance required.*       Date of Service 11/28/2022    Ivor Costa [facility]   If 7PM-7AM, please contact night-coverage [email or web address] 11/28/2022, 6:29 PM

## 2022-11-28 NOTE — ED Provider Notes (Signed)
[facility] Provider Note    Event Date/Time   First MD Initiated Contact with Patient 11/28/22 1335     (approximate)   History   Chief Complaint: Dizziness and Altered Mental Status   HPI  Ana Herrera is a 67 y.o. female with a history of atrial fibrillation on warfarin, gout, hypertension who comes to the ED due to confusion, upper abdominal pain radiating to her back and decreased oral intake for last 2 days.  No fever or vomiting.  Has a history of alcohol abuse but has been sober for 2 years.  Recently was on a course of amoxicillin for UTI.     Physical Exam   Triage Vital Signs: ED Triage Vitals  Enc Vitals Group     BP 11/28/22 1235 94/60     Pulse Rate 11/28/22 1235 (!) 112     Resp 11/28/22 1235 18     Temp 11/28/22 1235 98 F (36.7 C)     Temp Source 11/28/22 1235 Oral     SpO2 11/28/22 1235 96 %     Weight 11/28/22 1236 124 lb (56.2 kg)     Height 11/28/22 1231 '5\' 4"'$  (1.626 m)     Head Circumference --      Peak Flow --      Pain Score --      Pain Loc --      Pain Edu? --      Excl. in [place]? --     Most recent vital signs: Vitals:   11/28/22 1430 11/28/22 1515  BP: 96/62   Pulse:  92  Resp: (!) 24 16  Temp:    SpO2: 98% 99%    General: Awake, no distress.  Jaundiced CV:  Good peripheral perfusion.  Regular rate and rhythm. Resp:  Normal effort.  Clear to auscultation bilaterally Abd:  No distention.  Soft with right upper quadrant tenderness Other:  No lower extremity edema.  Dry mucous membranes.   ED Results / Procedures / Treatments   Labs (all labs ordered are listed, but only abnormal results are displayed) Labs Reviewed  COMPREHENSIVE METABOLIC PANEL - Abnormal; Notable for the following components:      Result Value   Sodium 133 (*)    Potassium 2.9 (*)    Chloride 97 (*)    CO2 21 (*)    Glucose, Bld 136 (*)    Creatinine, Ser 1.68 (*)    Calcium 8.5 (*)    Albumin 2.6 (*)    AST 87 (*)     ALT 45 (*)    Alkaline Phosphatase 345 (*)    Total Bilirubin 6.4 (*)    GFR, Estimated 33 (*)    All other components within normal limits  CBC - Abnormal; Notable for the following components:   WBC 30.5 (*)    Hemoglobin 10.2 (*)    HCT 33.2 (*)    MCV 71.7 (*)    MCH 22.0 (*)    RDW 17.3 (*)    All other components within normal limits  PROTIME-INR - Abnormal; Notable for the following components:   Prothrombin Time >90.0 (*)    INR >10.0 (*)    All other components within normal limits  CULTURE, BLOOD (ROUTINE X 2)  CULTURE, BLOOD (ROUTINE X 2)  RESP PANEL BY RT-PCR (RSV, FLU A&B, COVID)  [id number]  LIPASE, BLOOD  LACTIC ACID, PLASMA  AMMONIA  CBG MONITORING, ED     EKG Interpreted  by me Atrial fibrillation, rate of 112.  Normal axis, normal intervals.  Poor R wave progression.  Normal ST segments and T waves.   RADIOLOGY Ultrasound right upper quadrant interpreted by me, shows cholelithiasis and demonstrates choledocholithiasis with CBD dilated to 10 mm.  Gallbladder appears very abnormal, possibly with solid/loculated consistency   PROCEDURES:  Procedures   MEDICATIONS ORDERED IN ED: Medications  phytonadione (VITAMIN K) 5 mg in dextrose 5 % 50 mL IVPB (0 mg Intravenous Stopped 11/28/22 1435)  ketorolac (TORADOL) 15 MG/ML injection 15 mg (15 mg Intravenous Given 11/28/22 1401)  pantoprazole (PROTONIX) injection 40 mg (40 mg Intravenous Given 11/28/22 1401)  sodium chloride 0.9 % bolus 1,000 mL (0 mLs Intravenous Stopped 11/28/22 1505)  cefTRIAXone (ROCEPHIN) 2 g in sodium chloride 0.9 % 100 mL IVPB (0 g Intravenous Stopped 11/28/22 1503)  metroNIDAZOLE (FLAGYL) IVPB 500 mg (0 mg Intravenous Stopped 11/28/22 1418)  sodium chloride 0.9 % bolus 1,000 mL (1,000 mLs Intravenous New Bag/Given 11/28/22 1505)     IMPRESSION / MDM / ASSESSMENT AND PLAN / ED COURSE  I reviewed the triage vital signs and the nursing notes.  DDx: Cholecystitis, choledocholithiasis,  pancreatitis, gastritis, amoxicillin induced hepatic injury, dehydration, AKI, electrolyte abnormality, UTI, viral illness/COVID/flu  Patient's presentation is most consistent with acute presentation with potential threat to life or bodily function.  Patient presents with upper abdominal pain and jaundice.  Vitals are all essentially normal, mild tachycardia in the setting of atrial fibrillation which is essentially rate controlled.  Initial labs show some mild electrolyte abnormalities, LFTs elevated with alk phos of 345 and total bilirubin of 6.4.  White blood cell count is 30,000.  INR is greater than 10 in the context of warfarin use.  No serious bleeding.  Will give IV vitamin K for INR reversal.  Ultrasound right upper quadrant obtained which shows cholelithiasis and demonstrates choledocholithiasis.  GI reports that Dr. Allen Norris is available tomorrow for consideration of ERCP and since patient is stable, he would be appropriate for admission at [facility].   ----------------------------------------- 3:46 PM on 11/28/2022 ----------------------------------------- Radiology ultrasound report confirms cholelithiasis and choledocholithiasis.  Possible gallbladder mass.  Will order MRCP.       FINAL CLINICAL IMPRESSION(S) / ED DIAGNOSES   Final diagnoses:  Choledocholithiasis  Jaundice     Rx / DC Orders   ED Discharge Orders     None        Note:  This document was prepared using Dragon voice recognition software and may include unintentional dictation errors.   Carrie Mew, MD 11/28/22 [phone]

## 2022-11-29 ENCOUNTER — Inpatient Hospital Stay: Payer: Medicare Other | Admitting: Anesthesiology

## 2022-11-29 ENCOUNTER — Encounter
Admission: EM | Disposition: A | Discharge status: Home / Self Care | Payer: Self-pay | Source: Home / Self Care | Attending: Osteopathic Medicine

## 2022-11-29 ENCOUNTER — Encounter: Payer: Self-pay | Admitting: Internal Medicine

## 2022-11-29 ENCOUNTER — Telehealth: Payer: Self-pay

## 2022-11-29 ENCOUNTER — Inpatient Hospital Stay: Payer: Medicare Other

## 2022-11-29 DIAGNOSIS — R1013 Epigastric pain: Secondary | ICD-10-CM | POA: Diagnosis not present

## 2022-11-29 DIAGNOSIS — E871 Hypo-osmolality and hyponatremia: Secondary | ICD-10-CM

## 2022-11-29 DIAGNOSIS — R748 Abnormal levels of other serum enzymes: Secondary | ICD-10-CM

## 2022-11-29 DIAGNOSIS — K8309 Other cholangitis: Secondary | ICD-10-CM

## 2022-11-29 DIAGNOSIS — I639 Cerebral infarction, unspecified: Secondary | ICD-10-CM | POA: Diagnosis not present

## 2022-11-29 DIAGNOSIS — D72829 Elevated white blood cell count, unspecified: Secondary | ICD-10-CM

## 2022-11-29 DIAGNOSIS — I10 Essential (primary) hypertension: Secondary | ICD-10-CM

## 2022-11-29 DIAGNOSIS — E876 Hypokalemia: Secondary | ICD-10-CM

## 2022-11-29 DIAGNOSIS — D509 Iron deficiency anemia, unspecified: Secondary | ICD-10-CM

## 2022-11-29 DIAGNOSIS — Z720 Tobacco use: Secondary | ICD-10-CM

## 2022-11-29 DIAGNOSIS — R7989 Other specified abnormal findings of blood chemistry: Secondary | ICD-10-CM | POA: Diagnosis not present

## 2022-11-29 DIAGNOSIS — K8050 Calculus of bile duct without cholangitis or cholecystitis without obstruction: Secondary | ICD-10-CM | POA: Diagnosis not present

## 2022-11-29 DIAGNOSIS — R17 Unspecified jaundice: Secondary | ICD-10-CM | POA: Diagnosis not present

## 2022-11-29 DIAGNOSIS — R911 Solitary pulmonary nodule: Secondary | ICD-10-CM | POA: Insufficient documentation

## 2022-11-29 HISTORY — PX: ERCP: SHX5425

## 2022-11-29 LAB — CBC
HCT: 30.5 % — ABNORMAL LOW (ref 36.0–46.0)
Hemoglobin: 9.3 g/dL — ABNORMAL LOW (ref 12.0–15.0)
MCH: 21.5 pg — ABNORMAL LOW (ref 26.0–34.0)
MCHC: 30.5 g/dL (ref 30.0–36.0)
MCV: 70.6 fL — ABNORMAL LOW (ref 80.0–100.0)
Platelets: 304 10*3/uL (ref 150–400)
RBC: 4.32 MIL/uL (ref 3.87–5.11)
RDW: 17.3 % — ABNORMAL HIGH (ref 11.5–15.5)
WBC: 26.6 10*3/uL — ABNORMAL HIGH (ref 4.0–10.5)
nRBC: 0 % (ref 0.0–0.2)

## 2022-11-29 LAB — PROTIME-INR
INR: 1.8 — ABNORMAL HIGH (ref 0.8–1.2)
Prothrombin Time: 20.7 seconds — ABNORMAL HIGH (ref 11.4–15.2)

## 2022-11-29 LAB — COMPREHENSIVE METABOLIC PANEL
ALT: 31 U/L (ref 0–44)
AST: 44 U/L — ABNORMAL HIGH (ref 15–41)
Albumin: 2.2 g/dL — ABNORMAL LOW (ref 3.5–5.0)
Alkaline Phosphatase: 256 U/L — ABNORMAL HIGH (ref 38–126)
Anion gap: 11 (ref 5–15)
BUN: 30 mg/dL — ABNORMAL HIGH (ref 8–23)
CO2: 19 mmol/L — ABNORMAL LOW (ref 22–32)
Calcium: 8.2 mg/dL — ABNORMAL LOW (ref 8.9–10.3)
Chloride: 101 mmol/L (ref 98–111)
Creatinine, Ser: 1.68 mg/dL — ABNORMAL HIGH (ref 0.44–1.00)
GFR, Estimated: 33 mL/min — ABNORMAL LOW (ref >60)
Glucose, Bld: 100 mg/dL — ABNORMAL HIGH (ref 70–99)
Potassium: 3.7 mmol/L (ref 3.5–5.1)
Sodium: 131 mmol/L — ABNORMAL LOW (ref 135–145)
Total Bilirubin: 4.6 mg/dL — ABNORMAL HIGH (ref 0.3–1.2)
Total Protein: 5.4 g/dL — ABNORMAL LOW (ref 6.5–8.1)

## 2022-11-29 LAB — IRON AND TIBC
Iron: 21 ug/dL — ABNORMAL LOW (ref 28–170)
Saturation Ratios: 8 % — ABNORMAL LOW (ref 10.4–31.8)
TIBC: 253 ug/dL (ref 250–450)
UIBC: 232 ug/dL

## 2022-11-29 LAB — MAGNESIUM: Magnesium: 2 mg/dL (ref 1.7–2.4)

## 2022-11-29 LAB — FERRITIN: Ferritin: 126 ng/mL (ref 11–307)

## 2022-11-29 LAB — RETIC PANEL
Immature Retic Fract: 21.1 % — ABNORMAL HIGH (ref 2.3–15.9)
RBC.: 4.23 MIL/uL (ref 3.87–5.11)
Retic Count, Absolute: 45.7 10*3/uL (ref 19.0–186.0)
Retic Ct Pct: 1.1 % (ref 0.4–3.1)
Reticulocyte Hemoglobin: 19.7 pg — ABNORMAL LOW (ref >27.9)

## 2022-11-29 SURGERY — ERCP, WITH INTERVENTION IF INDICATED
Anesthesia: General

## 2022-11-29 MED ORDER — PROPOFOL 10 MG/ML IV BOLUS
INTRAVENOUS | Status: DC | PRN
  Administered 2022-11-29: 90 mg via INTRAVENOUS

## 2022-11-29 MED ORDER — PROPOFOL 500 MG/50ML IV EMUL
INTRAVENOUS | Status: DC | PRN
  Administered 2022-11-29: 125 ug/kg/min via INTRAVENOUS

## 2022-11-29 MED ORDER — LACTATED RINGERS IV SOLN: INTRAVENOUS | Status: DC

## 2022-11-29 MED ORDER — GLYCOPYRROLATE 0.2 MG/ML IJ SOLN
INTRAMUSCULAR | Status: DC | PRN
  Administered 2022-11-29: .1 mg via INTRAVENOUS

## 2022-11-29 MED ORDER — MELATONIN 5 MG PO TABS
5.0000 mg | ORAL_TABLET | Freq: Once | ORAL | Status: AC
  Administered 2022-11-29: 5 mg via ORAL
  Filled 2022-11-29: qty 1

## 2022-11-29 MED ORDER — DICLOFENAC SUPPOSITORY 100 MG: 100.0000 mg | Freq: Once | RECTAL | Status: DC

## 2022-11-29 MED ORDER — GLYCOPYRROLATE 0.2 MG/ML IJ SOLN
INTRAMUSCULAR | Status: AC
  Filled 2022-11-29: qty 1

## 2022-11-29 MED ORDER — PROPOFOL 10 MG/ML IV BOLUS
INTRAVENOUS | Status: AC
  Filled 2022-11-29: qty 20

## 2022-11-29 MED ORDER — LIDOCAINE HCL (PF) 2 % IJ SOLN
INTRAMUSCULAR | Status: AC
  Filled 2022-11-29: qty 5

## 2022-11-29 MED ORDER — DICLOFENAC SUPPOSITORY 100 MG
RECTAL | Status: AC
  Filled 2022-11-29: qty 1

## 2022-11-29 MED ORDER — LIDOCAINE HCL (CARDIAC) PF 100 MG/5ML IV SOSY
PREFILLED_SYRINGE | INTRAVENOUS | Status: DC | PRN
  Administered 2022-11-29: 60 mg via INTRAVENOUS

## 2022-11-29 MED ORDER — IOHEXOL 300 MG/ML  SOLN
INTRAMUSCULAR | Status: DC | PRN
  Administered 2022-11-29: 25 mL

## 2022-11-29 NOTE — Progress Notes (Incomplete)
       CROSS COVER NOTE  NAME: Ana Herrera MRN: [id number] DOB : [birth year] ATTENDING PHYSICIAN: Emeterio Reeve, DO    Date of Service   11/29/2022   HPI/Events of Note   Patient with increased confusion overnight. She is walking the halls with staff attempting to exit the unit and go in other patients rooms. When redirected away from entering other rooms Ana Herrera attempts to push staff out of her way. Haldol ordered with improvement in aggression. However, patient remains difficult to redirect and when I went to reassess her she was sitting at the nurses station with phone to her ear and had dialed 911. Benadryl and 0.5 mg Ativan ordered.  Interventions   Assessment/Plan:  AMS, agitation Haldol Benadryl, Ativan      To reach the provider On-Call:   7AM- 7PM see care teams to locate the attending and reach out to them via [email or web address] Password: [email or web address] 7PM-7AM contact night-coverage If you still have difficulty reaching the appropriate provider, please page the [facility] (Director on Call) for [facility] on amion for assistance  This document was prepared using Systems analyst and may include unintentional dictation errors.  Neomia Glass DNP, MBA, FNP-BC, PMHNP-BC Nurse Practitioner [facility] Pager [phone]

## 2022-11-29 NOTE — Anesthesia Postprocedure Evaluation (Signed)
Anesthesia Post Note  Patient: Ana Herrera  Procedure(s) Performed: ENDOSCOPIC RETROGRADE CHOLANGIOPANCREATOGRAPHY (ERCP)  Patient location during evaluation: PACU Anesthesia Type: General Level of consciousness: awake and alert, oriented and patient cooperative Pain management: pain level controlled Vital Signs Assessment: post-procedure vital signs reviewed and stable Respiratory status: spontaneous breathing, nonlabored ventilation and respiratory function stable Cardiovascular status: blood pressure returned to baseline and stable Postop Assessment: adequate PO intake Anesthetic complications: no   No notable events documented.   Last Vitals:  Vitals:   11/29/22 1230 11/29/22 1300  BP: (!) 147/86 111/76  Pulse:  (!) 58  Resp:    Temp:  36.7 C  SpO2:  100%    Last Pain:  Vitals:   11/29/22 1230  TempSrc:   PainSc: 0-No pain                 Darrin Nipper

## 2022-11-29 NOTE — Telephone Encounter (Signed)
ercp with stent removal off plavix in 2 months.

## 2022-11-29 NOTE — Transfer of Care (Signed)
Immediate Anesthesia Transfer of Care Note  Patient: Ana Herrera  Procedure(s) Performed: ENDOSCOPIC RETROGRADE CHOLANGIOPANCREATOGRAPHY (ERCP)  Patient Location: PACU  Anesthesia Type:General  Level of Consciousness: awake, alert , and oriented  Airway & Oxygen Therapy: Patient Spontanous Breathing  Post-op Assessment: Report given to RN and Post -op Vital signs reviewed and stable  Post vital signs: Reviewed and stable  Last Vitals:  Vitals Value Taken Time  BP 96/56   Temp 97   Pulse 129   Resp 16 11/29/22 1202  SpO2 96   Vitals shown include unvalidated device data.  Last Pain:  Vitals:   11/29/22 1112  TempSrc: Temporal  PainSc: 0-No pain         Complications: No notable events documented.

## 2022-11-29 NOTE — Consult Note (Signed)
[place] for Warfarin Indication: atrial fibrillation  Patient Measurements: Height: '5\' 4"'$  (162.6 cm) Weight: 56.2 kg (124 lb) IBW/kg (Calculated) : 54.7  Labs: Recent Labs    11/28/22 1237 11/29/22 0336  HGB 10.2* 9.3*  HCT 33.2* 30.5*  PLT 348 304  APTT 88*  --   LABPROT >90.0* 20.7*  INR >10.0* 1.8*  CREATININE 1.68* 1.68*    Estimated Creatinine Clearance: 28.4 mL/min (A) (by C-G formula based on SCr of 1.68 mg/dL (H)).   Medical History: Past Medical History:  Diagnosis Date   Alcohol abuse, in remission    Cerebral aneurysm    Chronic atrial fibrillation (HCC)    Gout    HTN (hypertension)    RLS (restless legs syndrome)    SAH (subarachnoid hemorrhage) (HCC)    Stroke (HCC)    Tobacco abuse    UTI (urinary tract infection)     Medications:  Wafarin 3 mg SuTuFr and 2 mg EOD (TWD: 17 mg)  Patient follows with anticoagulation clinic at [facility] in [place]. Regimen above as reported by them via telephone.  Assessment: 67 y/o F with medical history as above and including Afib / history of stroke on warfarin who is admitted with choledocholithiasis and cholangitis as well as gallbladder mass. Plan for ERCP 2/26. Pharmacy consulted for warfarin management while patient is admitted.   Given plan for ERCP 2/26, no warfarin will be given today. Will follow-up with GI to determine when it is safe to resume anticoagulation post-operatively.   Date INR Plan  2/25 > 10 Vitamin K 5 mg IV  2/26 1.8 HOLD   DDI: Zosyn Albumin low Diet NPO  Hgb 10.2 >> 9.3  Goal of Therapy:  INR 2-3  Plan:  --Hold warfarin tonight given plan for ERCP today. Warfarin appears adequately reversed for procedure --Will follow-up anticoagulation post-operatively 2/27. Patient may benefit from bridging therapy given history of stroke  [name] 11/29/2022,8:29 AM

## 2022-11-29 NOTE — Progress Notes (Signed)
Mobility Specialist - Progress Note   11/29/22 1539  Mobility  Activity Ambulated with assistance to bathroom  Level of Assistance Standby assist, set-up cues, supervision of patient - no hands on  Assistive Device None  Distance Ambulated (ft) 4 ft  Activity Response Tolerated well  $Mobility charge 1 Mobility   MS responding to bed alarm. Pt amb to the bathroom SBA, tolerated well. Pt left sitting in bathroom with needs within reach.   Candie Mile Mobility Specialist 11/29/22 3:42 PM

## 2022-11-29 NOTE — Progress Notes (Signed)
PROGRESS NOTE    Ana Herrera   [id number] DOB: September 20, 1956  DOA: 11/28/2022 Date of Service: 11/29/22 PCP: Valera Castle, MD     Brief Narrative / Hospital Course:  Ana Herrera is a 67 y.o. female with medical history significant of hypertension, stroke without deficit, gout, tobacco abuse, alcohol abuse in remission, atrial fibrillation on Coumadin, RLS, SAH, UTI, brain aneurysm 1.9, with clip repair per her son), who presents to ED from home on 11/28/22 with abdominal pain x1 month and altered mental status. Recent tx UTI w/ augmentin, UTI sx resolved. Worsening abd pain several days PTA, also developed jaundice and confusion.  02/25:  WBC 30.5, lactic acid 1.9, abnormal liver function (ALP 345, AST 87, ALT 45, total protein 6.4), ammonia 15, INR> 10, lipase 32, AKI with creatinine 1.68, BUN 21, GFR 33 (baseline creatinine 0.7 on 05/21/2019), temperature normal, blood pressure 96/62, heart rate 112, 92, RR 24, oxygen saturation 99% on room air. Pt is admitted to telemetry bed as inpatient. Abn RUQ [place] concerning for intraluminal gallbladder mass, biliary ductal dilatation w/ 5-6 mm stone in proximal CBD. Dr. Haig Prophet for GI was consulted --> plan ERCP 02/26: Leukocytosis some improved, hyponatremia slightly worse, AKI/Cr same, ALP and AST/ALT some improved, lower Hgb likely hemodilutional. Planning for ERCP today.   Consultants:  Gastroenterology   Procedures: Cindee Lame 11/29/22 ERCP]      ASSESSMENT & PLAN:   Principal Problem:   Abdominal pain Active Problems:   Choledocholithiasis   Abnormal LFTs   HTN (hypertension)   AKI (acute kidney injury) ([place])   Stroke (HCC)   Atrial fibrillation, chronic (HCC)   Supratherapeutic INR   Microcytic anemia   Hypokalemia   Hypomagnesemia   Cholangitis   Tobacco abuse   Hyponatremia   Elevated alkaline phosphatase level   Leukocytosis   Nodule of middle lobe of right lung  Abdominal pain d/t  choledocholithiasis, mod-severe cholangitis Gallbladder mass  Abnormal LFTs Meets sepsis criteria POA - brief tachycardia however pt also has Afib, leukocytosis, severe sepsis w/ AKI and AMS patient cannot undergo MRCP per MRI. Therefore CT-abd/pelvis without contrast was obtained (w/o contrast due to AKI) --> supports GB/hepatic mass,  IV Zosyn (patient received 1 dose of Rocephin and Flagyl in ED) Blood culture IV fluid: 2 L normal saline, then 100 cc/h As needed morphine for pain As needed Benadryl for nausea vomiting (patient cannot use Zofran due to QTc prolonged patient) plan ERCP 02/26  Lung nodule RML 1.9 cm CT chest non-contrast soon but not urgently    Essential HTN Hypotension here likely d/t infection Blood pressure is soft 96/62 Hold lisinopril Continue Cardizem which is for atrial fibrillation IV hydralazine as needed   AKI (acute kidney injury) ([place]):  Likely due to dehydration and continuation of NSAID and lisinopril Hold Voltaren and lisinopril IV fluid as above Follow BMP   Hyponatremia  Likely d/t liver disease Monitor BMP  Microcytic anemia  Anemia w/u added to labs Low threshold for transfusion w/ supratherapeutic INR   History of stroke ([place]) Hold warfarin due to supratherapeutic INR   Atrial fibrillation, chronic ([place]):  heart rate 112 --> 92.  Patient is on Coumadin at home Hold Coumadin due to supratherapeutic INR, and requiring ERCP procedure Cardizem for rate    Supratherapeutic INR:  INR> 10, no active bleeding S/p 5 mg of vitamin K following INR --> 1.8 today Consulted pharmacy to manage coumadin, possible restart tomorrow Continue to hold for now given anemia and  ERCP upcoming    Microcytic anemia:  Hemoglobin 10.2 (11.0 on 03/31/2017) Follow-up with CBC Retic, iron studies    Hypokalemia and hypomagnesemia:  Potassium 2.9, magnesium 1.5.  Phosphorus 3.2 Repleted both potassium and magnesium Monitor labs       Patient seems  coherent but not really able to follow the conversation about complex medical situation - question learning disability / mild cognitive impairment? Son appears to understand and appreciate the medical situation. If more complex decision making becomes necessary, would complete more formal assessment of decision-making capacity - at this point patient's goals are to get well and she is eager to undergo procedures and hopefully go home soon.      DVT prophylaxis: SCD, supratherapeutic INR precludes coumadin for now Pertinent IV fluids/nutrition: s/p 2L bolus and now on maintenance fluids, d/c as able  Central lines / invasive devices: none  Code Status: FULL CODE   Current Admission Status: inpatient  TOC needs / Dispo plan: TBD Barriers to discharge / significant pending items: pending ERCP, remain on IV abx pending culture results              Subjective / Brief ROS:  Patient reports feeling better today Denies CP/SOB.  Pain controlled.  Denies new weakness.  Reports no concerns w/ urination/defecation.   Family Communication: Son, Aaron Edelman, at bedside. He reports she seems about her baseline.     Objective Findings:  Vitals:   11/28/22 1515 11/28/22 1822 11/29/22 0345 11/29/22 0811  BP:  95/60 105/75 105/83  Pulse: 92 81 93 70  Resp: '16 20 20 18  '$ Temp:  98 F (36.7 C) 98.6 F (37 C) (!) 97.3 F (36.3 C)  TempSrc:      SpO2: 99% 99% 98% 99%  Weight:      Height:        Intake/Output Summary (Last 24 hours) at 11/29/2022 0954 Last data filed at 11/29/2022 0327 Gross per 24 hour  Intake 676.35 ml  Output --  Net 676.35 ml   Filed Weights   11/28/22 1236  Weight: 56.2 kg    Examination:  Physical Exam Constitutional:      General: She is not in acute distress. Cardiovascular:     Rate and Rhythm: Normal rate. Rhythm irregular.  Pulmonary:     Effort: Pulmonary effort is normal.     Breath sounds: Normal breath sounds.  Abdominal:     General:  Abdomen is flat.     Palpations: Abdomen is soft.  Musculoskeletal:     Right lower leg: No edema.     Left lower leg: No edema.  Skin:    General: Skin is warm and dry.  Neurological:     General: No focal deficit present.     Mental Status: She is alert. Mental status is at baseline.  Psychiatric:        Mood and Affect: Mood normal.        Behavior: Behavior normal.     Comments: Some tangential speech, unfocused          Scheduled Medications:   nicotine  21 mg Transdermal Daily    Continuous Infusions:  sodium chloride Stopped (11/29/22 0316)   piperacillin-tazobactam (ZOSYN)  IV 12.5 mL/hr at 11/29/22 0327    PRN Medications:  diphenhydrAMINE, hydrALAZINE, morphine injection  Antimicrobials from admission:  Anti-infectives (From admission, onward)    Start     Dose/Rate Route Frequency Ordered Stop   11/28/22 1700  piperacillin-tazobactam (ZOSYN) IVPB 3.375  g        3.375 g 12.5 mL/hr over 240 Minutes Intravenous Every 8 hours 11/28/22 1659     11/28/22 1400  piperacillin-tazobactam (ZOSYN) IVPB 3.375 g  Status:  Discontinued        3.375 g 100 mL/hr over 30 Minutes Intravenous  Once 11/28/22 1354 11/28/22 1355   11/28/22 1400  cefTRIAXone (ROCEPHIN) 2 g in sodium chloride 0.9 % 100 mL IVPB        2 g 200 mL/hr over 30 Minutes Intravenous  Once 11/28/22 1355 11/28/22 1503   11/28/22 1400  metroNIDAZOLE (FLAGYL) IVPB 500 mg        500 mg 100 mL/hr over 60 Minutes Intravenous  Once 11/28/22 1355 11/28/22 1418           Data Reviewed:  I have personally reviewed the following...  CBC: Recent Labs  Lab 11/28/22 1237 11/29/22 0336  WBC 30.5* 26.6*  HGB 10.2* 9.3*  HCT 33.2* 30.5*  MCV 71.7* 70.6*  PLT 348 [id number]   Basic Metabolic Panel: Recent Labs  Lab 11/28/22 1237 11/29/22 0336  NA 133* 131*  K 2.9* 3.7  CL 97* 101  CO2 21* 19*  GLUCOSE 136* 100*  BUN 21 30*  CREATININE 1.68* 1.68*  CALCIUM 8.5* 8.2*  MG 1.5* 2.0  PHOS 3.2  --     GFR: Estimated Creatinine Clearance: 28.4 mL/min (A) (by C-G formula based on SCr of 1.68 mg/dL (H)). Liver Function Tests: Recent Labs  Lab 11/28/22 1237 11/29/22 0336  AST 87* 44*  ALT 45* 31  ALKPHOS 345* 256*  BILITOT 6.4* 4.6*  PROT 6.5 5.4*  ALBUMIN 2.6* 2.2*   Recent Labs  Lab 11/28/22 1237  LIPASE 32   Recent Labs  Lab 11/28/22 1438  AMMONIA 15   Coagulation Profile: Recent Labs  Lab 11/28/22 1237 11/29/22 0336  INR >10.0* 1.8*   Cardiac Enzymes: No results for input(s): "CKTOTAL", "CKMB", "CKMBINDEX", "TROPONINI" in the last 168 hours. BNP (last 3 results) No results for input(s): "PROBNP" in the last 8760 hours. HbA1C: No results for input(s): "HGBA1C" in the last 72 hours. CBG: No results for input(s): "GLUCAP" in the last 168 hours. Lipid Profile: No results for input(s): "CHOL", "HDL", "LDLCALC", "TRIG", "CHOLHDL", "LDLDIRECT" in the last 72 hours. Thyroid Function Tests: No results for input(s): "TSH", "T4TOTAL", "FREET4", "T3FREE", "THYROIDAB" in the last 72 hours. Anemia Panel: Recent Labs    11/29/22 0335  FERRITIN 126  TIBC 253  IRON 21*  RETICCTPCT 1.1   Most Recent Urinalysis On File:  No results found for: "COLORURINE", "APPEARANCEUR", "LABSPEC", "PHURINE", "GLUCOSEU", "HGBUR", "BILIRUBINUR", "KETONESUR", "PROTEINUR", "UROBILINOGEN", "NITRITE", "LEUKOCYTESUR" Sepsis Labs: '@LABRCNTIP'$ (procalcitonin:4,lacticidven:4) Microbiology: Recent Results (from the past 240 hour(s))  Culture, blood (routine x 2)     Status: None (Preliminary result)   Collection Time: 11/28/22  2:38 PM   Specimen: BLOOD  Result Value Ref Range Status   Specimen Description BLOOD LEFT ANTECUBITAL  Final   Special Requests   Final    BOTTLES DRAWN AEROBIC AND ANAEROBIC Blood Culture adequate volume   Culture   Final    NO GROWTH < 24 HOURS Performed at [facility], [address] [place], [place] [address]    Report Status PENDING  Incomplete   Culture, blood (routine x 2)     Status: None (Preliminary result)   Collection Time: 11/28/22  2:38 PM   Specimen: BLOOD  Result Value Ref Range Status   Specimen Description BLOOD RIGHT  ANTECUBITAL  Final   Special Requests   Final    BOTTLES DRAWN AEROBIC AND ANAEROBIC Blood Culture adequate volume   Culture   Final    NO GROWTH < 24 HOURS Performed at [facility], [address] [place], [place] [address]    Report Status PENDING  Incomplete  Resp panel by RT-PCR (RSV, Flu A&B, Covid) Anterior Nasal Swab     Status: None   Collection Time: 11/28/22  4:54 PM   Specimen: Anterior Nasal Swab  Result Value Ref Range Status   SARS Coronavirus 2 by RT PCR NEGATIVE NEGATIVE Final    Comment: (NOTE) SARS-CoV-2 target nucleic acids are NOT DETECTED.  The SARS-CoV-2 RNA is generally detectable in upper respiratory specimens during the acute phase of infection. The lowest concentration of SARS-CoV-2 viral copies this assay can detect is 138 copies/mL. A negative result does not preclude SARS-Cov-2 infection and should not be used as the sole basis for treatment or other patient management decisions. A negative result may occur with  improper specimen collection/handling, submission of specimen other than nasopharyngeal swab, presence of viral mutation(s) within the areas targeted by this assay, and inadequate number of viral copies(<138 copies/mL). A negative result must be combined with clinical observations, patient history, and epidemiological information. The expected result is Negative.  Fact Sheet for Patients:  EntrepreneurPulse.com.au  Fact Sheet for Healthcare Providers:  IncredibleEmployment.be  This test is no t yet approved or cleared by the [place] FDA and  has been authorized for detection and/or diagnosis of SARS-CoV-2 by FDA under an Emergency Use Authorization (EUA). This EUA will remain  in effect (meaning  this test can be used) for the duration of the COVID-19 declaration under Section 564(b)(1) of the Act, 21 U.S.C.section 360bbb-3(b)(1), unless the authorization is terminated  or revoked sooner.       Influenza A by PCR NEGATIVE NEGATIVE Final   Influenza B by PCR NEGATIVE NEGATIVE Final    Comment: (NOTE) The Xpert Xpress SARS-CoV-2/FLU/RSV plus assay is intended as an aid in the diagnosis of influenza from Nasopharyngeal swab specimens and should not be used as a sole basis for treatment. Nasal washings and aspirates are unacceptable for Xpert Xpress SARS-CoV-2/FLU/RSV testing.  Fact Sheet for Patients: EntrepreneurPulse.com.au  Fact Sheet for Healthcare Providers: IncredibleEmployment.be  This test is not yet approved or cleared by the [place] FDA and has been authorized for detection and/or diagnosis of SARS-CoV-2 by FDA under an Emergency Use Authorization (EUA). This EUA will remain in effect (meaning this test can be used) for the duration of the COVID-19 declaration under Section 564(b)(1) of the Act, 21 U.S.C. section 360bbb-3(b)(1), unless the authorization is terminated or revoked.     Resp Syncytial Virus by PCR NEGATIVE NEGATIVE Final    Comment: (NOTE) Fact Sheet for Patients: EntrepreneurPulse.com.au  Fact Sheet for Healthcare Providers: IncredibleEmployment.be  This test is not yet approved or cleared by the [place] FDA and has been authorized for detection and/or diagnosis of SARS-CoV-2 by FDA under an Emergency Use Authorization (EUA). This EUA will remain in effect (meaning this test can be used) for the duration of the COVID-19 declaration under Section 564(b)(1) of the Act, 21 U.S.C. section 360bbb-3(b)(1), unless the authorization is terminated or revoked.  Performed at [facility], [address], [place],  [address]       Radiology Studies  last 3 days: CT ABDOMEN PELVIS WO CONTRAST  Result Date: 11/29/2022 CLINICAL DATA:  Intermittent abdominal  pain for almost a month. EXAM: CT ABDOMEN AND PELVIS WITHOUT CONTRAST TECHNIQUE: Multidetector CT imaging of the abdomen and pelvis was performed following the standard protocol without IV contrast. RADIATION DOSE REDUCTION: This exam was performed according to the departmental dose-optimization program which includes automated exposure control, adjustment of the mA and/or kV according to patient size and/or use of iterative reconstruction technique. COMPARISON:  Ultrasound 11/28/2022 FINDINGS: Lower chest: Bronchial wall thickening and peribronchovascular atelectasis/scarring in the lower lungs. Associated patchy ground-glass opacities. Irregular nodular opacity in the right middle lobe (series 4/image 2) measures 1.9 x 1.8 cm. Hepatobiliary: Lack of IV contrast limits evaluation. There is a ill-defined heterogenous appearance of the gallbladder extending into the hepatic hilum. The common bile duct is dilated measuring approximately 13 mm in diameter. Possible mild intrahepatic biliary dilation. Poorly defined hypoattenuating area in the right hepatic lobe (2/17) measuring 8 mm. Pancreas: Unremarkable. No pancreatic ductal dilatation or surrounding inflammatory changes. Spleen: Normal in size without focal abnormality. Adrenals/Urinary Tract: Unremarkable adrenal glands. No urinary calculi or hydronephrosis. Unremarkable bladder. Stomach/Bowel: Normal caliber large and small bowel. Enteric contrast is present within the small bowel and colon normal appendix. Vascular/Lymphatic: Calcified plaque in the aorta. No definite adenopathy. Reproductive: Uterus and bilateral adnexa are unremarkable. Other: No free intraperitoneal air.  No abdominal wall hernia. Musculoskeletal: Body wall edema. No acute fracture or aggressive osseous lesion. Advanced degenerative disc disease in the lumbar spine. IMPRESSION: 1.  Ill-defined heterogenous appearance of the gallbladder which is difficult to discern from the adjacent hepatic parenchyma. Dilation of the common bile duct and possible mild intrahepatic biliary dilation. Findings are incompletely evaluated without IV contrast but are concerning for hepatic or gallbladder mass. Further assessment with MRI/MRCP is recommended. 2. Poorly defined hypoattenuating area in the right hepatic lobe measuring 8 mm. Recommend attention on follow-up. 3. Irregular nodular opacity in the right middle lobe measuring up to 1.9 cm. This may be related to chronic scarring/atelectasis. Per Fleischner Society Guidelines, recommend prompt non-contrast Chest CT for further evaluation. Reference: Radiology. 2017; 284(1):228-43. Aortic Atherosclerosis (ICD10-I70.0). Electronically Signed   By: Placido Sou M.D.   On: 11/29/2022 01:42   CT HEAD WO CONTRAST (5MM)  Result Date: 11/28/2022 CLINICAL DATA:  Mental status change, unknown cause Confusion. EXAM: CT HEAD WITHOUT CONTRAST TECHNIQUE: Contiguous axial images were obtained from the base of the skull through the vertex without intravenous contrast. RADIATION DOSE REDUCTION: This exam was performed according to the departmental dose-optimization program which includes automated exposure control, adjustment of the mA and/or kV according to patient size and/or use of iterative reconstruction technique. COMPARISON:  None Available. FINDINGS: Brain: No acute hemorrhage, evidence of ischemia or subdural/extra-axial collection. Brain volume is normal, no hydrocephalus. Moderate to advanced periventricular and deep white matter hypodensity typical of chronic small vessel ischemic change. Small amount of encephalomalacia adjacent to right-sided aneurysm clip. Remote lacunar infarcts in the right basal ganglia and left caudate. Vascular: Right-sided aneurysm clip. Skull base atherosclerosis. No hyperdense vessel. Skull: Prior right-sided craniotomy.  No  fracture or focal lesion. Sinuses/Orbits: Scattered mucosal thickening of ethmoid air cells. Left-sided sphenoid sinus retained secretions. No mastoid effusion. Other: None. IMPRESSION: 1. No acute intracranial abnormality. 2. Prior right-sided craniotomy with aneurysm clip in place. 3. Moderate to advanced chronic small vessel ischemic change. Remote lacunar infarcts in the right basal ganglia and left caudate. Electronically Signed   By: Keith Rake M.D.   On: 11/28/2022 16:54   US ABDOMEN LIMITED RUQ (LIVER/GB)  Result Date:  11/28/2022 CLINICAL DATA:  Right upper quadrant pain.  Jaundice. EXAM: ULTRASOUND ABDOMEN LIMITED RIGHT UPPER QUADRANT COMPARISON:  None Available. FINDINGS: Technically challenging exam, patient had difficulty remaining still. Gallbladder: The gallbladder wall is poorly defined. There is heterogeneous gallbladder contents some of which has shadowing likely representing intraluminal stones. Difficult to delineate the gallbladder wall from the adjacent hepatic parenchyma. No sonographic Murphy sign noted by sonographer. Common bile duct: Diameter: 11 mm. A stone is seen within the proximal duct measuring 5-6 mm. Liver: There is intrahepatic biliary ductal dilatation. No focal lesion identified. Slight increased echogenicity of the portal triads. Portal vein is patent on color Doppler imaging with normal direction of blood flow towards the liver. Other: No right upper quadrant ascites. IMPRESSION: 1. Abnormal appearance of the gallbladder, heterogeneous gallbladder contents, possibly solid. Poorly defined wall that is indistinguishable from the adjacent liver parenchyma. Findings are suspicious for intraluminal mass. In addition there is some posterior shadowing suggestive of calcifications are stones. Recommend further assessment with MRI. 2. Intra and extrahepatic biliary ductal dilatation. 5-6 mm stone is seen within the proximal common bile duct. Electronically Signed   By: Keith Rake M.D.   On: 11/28/2022 15:40             LOS: 1 day    Time spent: 50 min    Emeterio Reeve, DO [facility] 11/29/2022, 9:54 AM    Dictation software may have been used to generate the above note. Typos may occur and escape review in typed/dictated notes. Please contact Dr Sheppard Coil directly for clarity if needed.  Staff may message me via secure chat in [place]  but this may not receive an immediate response,  please page me for urgent matters!  If 7PM-7AM, please contact night coverage [email or web address]

## 2022-11-29 NOTE — Anesthesia Preprocedure Evaluation (Addendum)
Anesthesia Evaluation  Patient identified by MRN, date of birth, ID band Patient awake    Reviewed: Allergy & Precautions, NPO status , Patient's Chart, lab work & pertinent test results  History of Anesthesia Complications Negative for: history of anesthetic complications  Airway Mallampati: IV   Neck ROM: Full    Dental  (+) Poor Dentition   Pulmonary Current Smoker (less than 1 ppd) and Patient abstained from smoking.   Pulmonary exam normal breath sounds clear to auscultation       Cardiovascular hypertension, Normal cardiovascular exam+ dysrhythmias (a fib on warfarin at home)  Rhythm:Regular Rate:Normal  ECG 11/28/22:  Atrial fibrillation with rapid ventricular response Possible Anterior infarct , age undetermined   Neuro/Psych Hx cerebral aneurysm s/p clip; hx SAH; alcohol use disorder, none in 2 years CVA (greater than 5 years ago; no residual deficits)    GI/Hepatic negative GI ROS,,,  Endo/Other  negative endocrine ROS    Renal/GU negative Renal ROS     Musculoskeletal Gout    Abdominal   Peds  Hematology  (+) Blood dyscrasia, anemia   Anesthesia Other Findings   Reproductive/Obstetrics                             Anesthesia Physical Anesthesia Plan  ASA: 4  Anesthesia Plan: General   Post-op Pain Management:    Induction: Intravenous  PONV Risk Score and Plan: 2 and Propofol infusion, TIVA and Treatment may vary due to age or medical condition  Airway Management Planned: Natural Airway  Additional Equipment:   Intra-op Plan:   Post-operative Plan:   Informed Consent: I have reviewed the patients History and Physical, chart, labs and discussed the procedure including the risks, benefits and alternatives for the proposed anesthesia with the patient or authorized representative who has indicated his/her understanding and acceptance.       Plan Discussed with:  CRNA  Anesthesia Plan Comments: (LMA/GETA backup discussed.  Patient consented for risks of anesthesia including but not limited to:  - adverse reactions to medications - damage to eyes, teeth, lips or other oral mucosa - nerve damage due to positioning  - sore throat or hoarseness - damage to heart, brain, nerves, lungs, other parts of body or loss of life  Informed patient about role of CRNA in peri- and intra-operative care.  Patient voiced understanding.)        Anesthesia Quick Evaluation

## 2022-11-29 NOTE — Progress Notes (Signed)
   11/29/22 2237  Assess: MEWS Score  Temp 97.6 F (36.4 C)  BP (!) 162/118  MAP (mmHg) 131  Pulse Rate (!) 116  Resp 20  Level of Consciousness Alert  SpO2 99 %  O2 Device Room Air  Assess: MEWS Score  MEWS Temp 0  MEWS Systolic 0  MEWS Pulse 2  MEWS RR 0  MEWS LOC 0  MEWS Score 2  MEWS Score Color Yellow  Assess: if the MEWS score is Yellow or Red  Were vital signs taken at a resting state? Yes  Focused Assessment Change from prior assessment (see assessment flowsheet) (increased confusion)  Does the patient meet 2 or more of the SIRS criteria? No  MEWS guidelines implemented  Yes, yellow  Treat  MEWS Interventions Considered administering scheduled or prn medications/treatments as ordered  Take Vital Signs  Increase Vital Sign Frequency  Yellow: Q2hr x1, continue Q4hrs until patient remains green for 12hrs  Escalate  MEWS: Escalate Yellow: Discuss with charge nurse and consider notifying provider and/or RRT  Notify: Charge Nurse/RN  Name of Charge Nurse/RN Notified Jodi, RN  Provider Notification  Provider Name/Title Neomia Glass  Date Provider Notified 11/29/22  Time Provider Notified 2235  Method of Notification Page  Notification Reason Change in status  Provider response At bedside  Date of Provider Response 11/29/22  Time of Provider Response 2241  Notify: Rapid Response  Name of Rapid Response RN Notified .  Assess: SIRS CRITERIA  SIRS Temperature  0  SIRS Pulse 1  SIRS Respirations  0  SIRS WBC 0  SIRS Score Sum  1

## 2022-11-29 NOTE — Hospital Course (Addendum)
Ana Herrera is a 67 y.o. female with medical history significant of hypertension, stroke without deficit, gout, tobacco abuse, alcohol abuse in remission, atrial fibrillation on Coumadin, RLS, SAH, UTI, brain aneurysm 1.9, with clip repair per her son), who presents to ED from home on 11/28/22 with abdominal pain x1 month and altered mental status. Recent tx UTI w/ augmentin, UTI sx resolved. Worsening abd pain several days PTA, also developed jaundice and confusion.  02/25:  WBC 30.5, lactic acid 1.9, abnormal liver function (ALP 345, AST 87, ALT 45, total protein 6.4), ammonia 15, INR> 10, lipase 32, AKI with creatinine 1.68, BUN 21, GFR 33 (baseline creatinine 0.7 on 05/21/2019), temperature normal, blood pressure 96/62, heart rate 112, 92, RR 24, oxygen saturation 99% on room air. Pt is admitted to telemetry bed as inpatient. Abn RUQ [place] concerning for intraluminal gallbladder mass, biliary ductal dilatation w/ 5-6 mm stone in proximal CBD. Dr. Haig Prophet for GI was consulted --> plan ERCP 02/26: Leukocytosis some improved, hyponatremia slightly worse, AKI/Cr same, ALP and AST/ALT some improved, lower Hgb likely hemodilutional. ERCP today --> biliary stent placed 02/27: AST/ALT improving, Cr about same at 1.66 and GFR 34. Significant confusion overnight requiring sedating meds. Continue to monitor through today. Awaiting recs from IR re: if able to biopsy GB/haptic mass vs need for further imaging first vs gen surg consult. CLD today, advancing per GI. On heparin in case procedure needed, can transition back onto coumadin if nothing more invasive is planned   Consultants:  Gastroenterology   Procedures: 11/29/22 ERCP w/ placement biliary stent       ASSESSMENT & PLAN:   Principal Problem:   Abdominal pain Active Problems:   Choledocholithiasis   Abnormal LFTs   HTN (hypertension)   AKI (acute kidney injury) ([place])   Stroke ([place])   Atrial fibrillation, chronic (HCC)   Supratherapeutic  INR   Microcytic anemia   Hypokalemia   Hypomagnesemia   Cholangitis   Tobacco abuse   Hyponatremia   Elevated alkaline phosphatase level   Leukocytosis   Nodule of middle lobe of right lung  Abdominal pain d/t choledocholithiasis - improved Abnormal LFTs - improved Meets sepsis criteria POA - brief tachycardia however pt also has Afib, leukocytosis, severe sepsis w/ AKI and AMS - improved  11/29/22 ERCP w/ biliary stent  patient cannot undergo MRCP per MRI. Therefore CT-abd/pelvis without contrast was obtained (w/o contrast due to AKI) --> supports GB/hepatic mass,  IV Zosyn (patient received 1 dose of Rocephin and Flagyl in ED) Blood cultures, no growth thus far  IV fluid: 2 L normal saline, then 100 cc/h As needed morphine for pain CLD per GI for now  Watch for pancreatitis, bleeding, perforation, and cholangitis plan ERCP in 2 mos to remove stent outpatient   Gallbladder/Hepatic mass noted on noncontrast CT  Awaiting recs from IR re: if able to biopsy mass while she is here, vs need for further imaging first w/ contrast once AKI improved vs gen surg consult.   Lung nodule RML 1.9 cm CT chest non-contrast soon but not urgently    Essential HTN Hypotension here likely d/t infection Blood pressure is soft 96/62 Hold lisinopril Continue Cardizem which is for atrial fibrillation IV hydralazine as needed   AKI (acute kidney injury) ([place]):  Likely due to dehydration and continuation of NSAID and lisinopril Hold Voltaren and lisinopril IV fluid as above Follow BMP   Hyponatremia  Likely d/t liver disease Monitor BMP  Microcytic anemia  Anemia w/u  added to labs --> iron deficient Low threshold for transfusion w/ supratherapeutic INR   History of stroke ([place]) Hold warfarin due to supratherapeutic INR   Atrial fibrillation, chronic ([place]):  heart rate 112 --> 92.  Patient is on Coumadin at home Hold Coumadin due to supratherapeutic INR, and requiring ERCP  procedure Heparin for now for CVA prevention  Cardizem for rate    Supratherapeutic INR:  INR> 10, no active bleeding S/p 5 mg of vitamin K following INR --> 1.8 today Consulted pharmacy to manage coumadin, possible restart tomorrow Continue to hold for now given anemia and ERCP    Microcytic anemia:  Hemoglobin 10.2 (11.0 on 03/31/2017) Follow-up with CBC Retic, iron studies    Hypokalemia and hypomagnesemia:  Potassium 2.9, magnesium 1.5.  Phosphorus 3.2 Repleted both potassium and magnesium Monitor labs       Patient seems coherent but not really able to follow the conversation about complex medical situation - question learning disability / mild cognitive impairment? Son appears to understand and appreciate the medical situation. If more complex decision making becomes necessary, would complete more formal assessment of decision-making capacity - at this point patient's goals are to get well and she is eager to undergo procedures and hopefully go home soon. Son is very reasonable and understanding, plan is for her to go back home w/ him on eventual discharge - he is primary caretaker and she lives w/ him.      DVT prophylaxis: SCD, supratherapeutic INR precludes coumadin for now but may restart tomorrow if no further procedures planned, started heparin today  Pertinent IV fluids/nutrition: s/p 2L bolus and now on maintenance fluids, d/c as able  Central lines / invasive devices: none  Code Status: FULL CODE   Current Admission Status: inpatient  TOC needs / Dispo plan: TBD Barriers to discharge / significant pending items: pending ERCP, remain on IV abx pending culture results

## 2022-11-29 NOTE — Op Note (Signed)
[facility] Gastroenterology Patient Name: Ana Herrera Procedure Date: 11/29/2022 11:16 AM MRN: [id number] Account #: [id number] Date of Birth: [birth year] Admit Type: Outpatient Age: 67 Room: [facility] ENDO ROOM 4 Gender: Female Note Status: Finalized Instrument Name: Selinda Eon [id number] Procedure:             ERCP Indications:           Common bile duct stone(s), Elevated liver enzymes,                         patient on anticoagulation Providers:             Lucilla Lame MD, MD Medicines:             Propofol per Anesthesia Complications:         No immediate complications. Procedure:             Pre-Anesthesia Assessment:                        - Prior to the procedure, a History and Physical was                         performed, and patient medications and allergies were                         reviewed. The patient's tolerance of previous                         anesthesia was also reviewed. The risks and benefits                         of the procedure and the sedation options and risks                         were discussed with the patient. All questions were                         answered, and informed consent was obtained. Prior                         Anticoagulants: The patient has taken Coumadin                         (warfarin), last dose was 1 day prior to procedure.                         ASA Grade Assessment: II - A patient with mild                         systemic disease. After reviewing the risks and                         benefits, the patient was deemed in satisfactory                         condition to undergo the procedure.                        After obtaining informed consent, the scope was  passed                         under direct vision. Throughout the procedure, the                         patient's blood pressure, pulse, and oxygen                         saturations were monitored continuously. The                          Duodenoscope was introduced through the mouth, and                         used to inject contrast into and used to inject                         contrast into the bile duct. The ERCP was accomplished                         without difficulty. The patient tolerated the                         procedure well. Findings:      The scout film was normal. The major papilla was bulging. The bile duct       was deeply cannulated with the short-nosed traction sphincterotome.       Contrast was injected. I personally interpreted the bile duct images.       There was brisk flow of contrast through the ducts. Image quality was       excellent. Contrast extended to the entire biliary tree. The main bile       duct was diffusely dilated. A wire was passed into the biliary tree. One       7 Fr by 7 cm biliary stent with a single external flap and a single       internal flap was placed 5 cm into the common bile duct. Bile flowed       through the stent. The stent was in good position. Impression:            - The major papilla appeared to be bulging.                        - The entire main bile duct was dilated.                        - One biliary stent was placed into the common bile                         duct. Recommendation:        - Return patient to hospital ward for ongoing care.                        - Clear liquid diet.                        - Repeat ERCP in 2 months to remove stent.                        -  Watch for pancreatitis, bleeding, perforation, and                         cholangitis. Procedure Code(s):     --- Professional ---                        [id number], Endoscopic retrograde cholangiopancreatography                         (ERCP); with placement of endoscopic stent into                         biliary or pancreatic duct, including pre- and                         post-dilation and guide wire passage, when performed,                         including sphincterotomy, when  performed, each stent                        [id number], Endoscopic catheterization of the biliary                         ductal system, radiological supervision and                         interpretation Diagnosis Code(s):     --- Professional ---                        K80.50, Calculus of bile duct without cholangitis or                         cholecystitis without obstruction CPT copyright 2022 American Medical Association. All rights reserved. The codes documented in this report are preliminary and upon coder review may  be revised to meet current compliance requirements. Lucilla Lame MD, MD 11/29/2022 11:59:11 AM This report has been signed electronically. Number of Addenda: 0 Note Initiated On: 11/29/2022 11:16 AM Estimated Blood Loss:  Estimated blood loss: none.      [facility]

## 2022-11-30 ENCOUNTER — Encounter: Payer: Self-pay | Admitting: Gastroenterology

## 2022-11-30 DIAGNOSIS — I10 Essential (primary) hypertension: Secondary | ICD-10-CM | POA: Diagnosis not present

## 2022-11-30 DIAGNOSIS — R17 Unspecified jaundice: Secondary | ICD-10-CM

## 2022-11-30 DIAGNOSIS — K8050 Calculus of bile duct without cholangitis or cholecystitis without obstruction: Secondary | ICD-10-CM | POA: Diagnosis not present

## 2022-11-30 DIAGNOSIS — R1013 Epigastric pain: Secondary | ICD-10-CM | POA: Diagnosis not present

## 2022-11-30 DIAGNOSIS — I639 Cerebral infarction, unspecified: Secondary | ICD-10-CM | POA: Diagnosis not present

## 2022-11-30 LAB — BASIC METABOLIC PANEL
Anion gap: 10 (ref 5–15)
BUN: 35 mg/dL — ABNORMAL HIGH (ref 8–23)
CO2: 20 mmol/L — ABNORMAL LOW (ref 22–32)
Calcium: 8.5 mg/dL — ABNORMAL LOW (ref 8.9–10.3)
Chloride: 105 mmol/L (ref 98–111)
Creatinine, Ser: 1.66 mg/dL — ABNORMAL HIGH (ref 0.44–1.00)
GFR, Estimated: 34 mL/min — ABNORMAL LOW (ref >60)
Glucose, Bld: 86 mg/dL (ref 70–99)
Potassium: 3.6 mmol/L (ref 3.5–5.1)
Sodium: 135 mmol/L (ref 135–145)

## 2022-11-30 LAB — CBC
HCT: 31.8 % — ABNORMAL LOW (ref 36.0–46.0)
Hemoglobin: 9.6 g/dL — ABNORMAL LOW (ref 12.0–15.0)
MCH: 21.3 pg — ABNORMAL LOW (ref 26.0–34.0)
MCHC: 30.2 g/dL (ref 30.0–36.0)
MCV: 70.5 fL — ABNORMAL LOW (ref 80.0–100.0)
Platelets: 299 10*3/uL (ref 150–400)
RBC: 4.51 MIL/uL (ref 3.87–5.11)
RDW: 17.4 % — ABNORMAL HIGH (ref 11.5–15.5)
WBC: 13.1 10*3/uL — ABNORMAL HIGH (ref 4.0–10.5)
nRBC: 0 % (ref 0.0–0.2)

## 2022-11-30 LAB — HEPATIC FUNCTION PANEL
ALT: 27 U/L (ref 0–44)
AST: 34 U/L (ref 15–41)
Albumin: 2.1 g/dL — ABNORMAL LOW (ref 3.5–5.0)
Alkaline Phosphatase: 236 U/L — ABNORMAL HIGH (ref 38–126)
Bilirubin, Direct: 1.9 mg/dL — ABNORMAL HIGH (ref 0.0–0.2)
Indirect Bilirubin: 1.3 mg/dL — ABNORMAL HIGH (ref 0.3–0.9)
Total Bilirubin: 3.2 mg/dL — ABNORMAL HIGH (ref 0.3–1.2)
Total Protein: 5.5 g/dL — ABNORMAL LOW (ref 6.5–8.1)

## 2022-11-30 LAB — APTT: aPTT: 33 seconds (ref 24–36)

## 2022-11-30 LAB — PROTIME-INR
INR: 1.5 — ABNORMAL HIGH (ref 0.8–1.2)
Prothrombin Time: 18.2 seconds — ABNORMAL HIGH (ref 11.4–15.2)

## 2022-11-30 LAB — HEPARIN LEVEL (UNFRACTIONATED): Heparin Unfractionated: <0.1 IU/mL — ABNORMAL LOW (ref 0.30–0.70)

## 2022-11-30 LAB — HIV ANTIBODY (ROUTINE TESTING W REFLEX): HIV Screen 4th Generation wRfx: NONREACTIVE

## 2022-11-30 MED ORDER — QUETIAPINE FUMARATE 25 MG PO TABS: 25.0000 mg | ORAL_TABLET | Freq: Once | ORAL | Status: DC

## 2022-11-30 MED ORDER — HEPARIN (PORCINE) 25000 UT/250ML-% IV SOLN
1400.0000 [IU]/h | INTRAVENOUS | Status: DC
  Administered 2022-11-30: 800 [IU]/h via INTRAVENOUS
  Administered 2022-12-02: 1400 [IU]/h via INTRAVENOUS
  Filled 2022-11-30 (×3): qty 250

## 2022-11-30 MED ORDER — DIPHENHYDRAMINE HCL 50 MG/ML IJ SOLN
25.0000 mg | Freq: Once | INTRAMUSCULAR | Status: AC
  Administered 2022-11-30: 25 mg via INTRAVENOUS
  Filled 2022-11-30: qty 1

## 2022-11-30 MED ORDER — HEPARIN BOLUS VIA INFUSION
1650.0000 [IU] | Freq: Once | INTRAVENOUS | Status: AC
  Administered 2022-12-01: 1650 [IU] via INTRAVENOUS
  Filled 2022-11-30: qty 1650

## 2022-11-30 MED ORDER — LORAZEPAM 2 MG/ML IJ SOLN
0.5000 mg | Freq: Once | INTRAMUSCULAR | Status: AC
  Administered 2022-11-30: 0.5 mg via INTRAVENOUS
  Filled 2022-11-30: qty 1

## 2022-11-30 MED ORDER — HALOPERIDOL LACTATE 5 MG/ML IJ SOLN
2.0000 mg | Freq: Once | INTRAMUSCULAR | Status: AC
  Administered 2022-11-30: 2 mg via INTRAVENOUS
  Filled 2022-11-30: qty 1

## 2022-11-30 NOTE — Progress Notes (Signed)
GI Inpatient Follow-up Note  Subjective:  Patient seen and doing well. Seems to have had some delirium this morning. No abdominal pain.  Scheduled Inpatient Medications:   nicotine  21 mg Transdermal Daily    Continuous Inpatient Infusions:    sodium chloride 100 mL/hr at 11/30/22 0615   heparin     piperacillin-tazobactam (ZOSYN)  IV 3.375 g (11/30/22 1037)    PRN Inpatient Medications:  hydrALAZINE, morphine injection  Review of Systems:  Review of Systems  Constitutional:  Negative for chills, fever and malaise/fatigue.  Respiratory:  Negative for shortness of breath.   Gastrointestinal:  Negative for abdominal pain.  Musculoskeletal:  Negative for joint pain.  Skin:  Negative for rash.  Neurological:  Negative for focal weakness.  Psychiatric/Behavioral:  Negative for substance abuse.   All other systems reviewed and are negative.    Physical Examination: BP 111/72 (BP Location: Right Arm)   Pulse (!) 110   Temp 97.6 F (36.4 C)   Resp 20   Ht '5\' 4"'$  (1.626 m)   Wt 56.2 kg   LMP  (Exact Date) Comment: postmenopausal  SpO2 98%   BMI 21.27 kg/m  Gen: NAD, alert and oriented x 4 HEENT: PEERLA, EOMI, Neck: supple, no JVD or thyromegaly Chest: No respiratory distress Abd: soft, non-tender, non-distended, no HSM, guarding, ridigity, or rebound tenderness Ext: no edema, well perfused with 2+ pulses, Skin: no rash or lesions noted Lymph: no LAD  Data: Lab Results  Component Value Date   WBC 13.1 (H) 11/30/2022   HGB 9.6 (L) 11/30/2022   HCT 31.8 (L) 11/30/2022   MCV 70.5 (L) 11/30/2022   PLT 299 11/30/2022   Recent Labs  Lab 11/28/22 1237 11/29/22 0336 11/30/22 0917  HGB 10.2* 9.3* 9.6*   Lab Results  Component Value Date   NA 135 11/30/2022   K 3.6 11/30/2022   CL 105 11/30/2022   CO2 20 (L) 11/30/2022   BUN 35 (H) 11/30/2022   CREATININE 1.66 (H) 11/30/2022   Lab Results  Component Value Date   ALT 27 11/30/2022   AST 34 11/30/2022    ALKPHOS 236 (H) 11/30/2022   BILITOT 3.2 (H) 11/30/2022   Recent Labs  Lab 11/30/22 0906 11/30/22 0917  APTT 33  --   INR  --  1.5*   Assessment/Plan: Ana Herrera is a 67 y.o. lady with history of cerebral aneurysm which required a clip, a. Fib on coumadin with last dose today, and hypertension here with 48 hours of severe abdominal pain with radiation to her back, chills, fatigue, and brain fog consistent with moderate to severe cholangitis from choledocholithiasis seen on ultrasound. S/p ERCP with stent placement. Sphincterotomy not performed due to coagulopathy.  Recommendations:  - continue antibiotics (IV or PO) for another 5 days - ok to advance diet from GI standpoint - f/u on imaging/biopsy of mass - will need stent removed in two months, appointment has already been made - per guidelines, patient would need in hospital cholecystectomy but work-up of possible gallbladder mass is pending  Will follow peripherally, please call with any questions or concerns.  Raylene Miyamoto MD, MPH [place]

## 2022-11-30 NOTE — Consult Note (Addendum)
[facility] for Warfarin / IV heparin Indication: atrial fibrillation / Hx CVA  Patient Measurements: Height: '5\' 4"'$  (162.6 cm) Weight: 56.2 kg (123 lb 14.4 oz) IBW/kg (Calculated) : 54.7 Heparin dosing weight: 56.2 kg  Labs: Recent Labs    11/28/22 1237 11/29/22 0336  HGB 10.2* 9.3*  HCT 33.2* 30.5*  PLT 348 304  APTT 88*  --   LABPROT >90.0* 20.7*  INR >10.0* 1.8*  CREATININE 1.68* 1.68*    Estimated Creatinine Clearance: 28.4 mL/min (A) (by C-G formula based on SCr of 1.68 mg/dL (H)).  Medical History: Past Medical History:  Diagnosis Date   Alcohol abuse, in remission    Cerebral aneurysm    Chronic atrial fibrillation (HCC)    Gout    HTN (hypertension)    RLS (restless legs syndrome)    SAH (subarachnoid hemorrhage) (HCC)    Stroke (HCC)    Tobacco abuse    UTI (urinary tract infection)     Medications:  Wafarin 3 mg SuTuFr and 2 mg EOD (TWD: 17 mg)  Patient follows with anticoagulation clinic at [facility] in [place]. Regimen above as reported by them via telephone.  Assessment: 67 y/o F with medical history as above and including Afib / history of stroke on warfarin who is admitted with choledocholithiasis and cholangitis as well as gallbladder mass. Plan for ERCP 2/26. Pharmacy consulted for warfarin management and IV heparin bridge while patient is admitted.   Date INR Plan  2/25 > 10 Vitamin K 5 mg IV  2/26 1.8 HOLD  2/27 1.5 HOLD   DDI: Zosyn Albumin low Diet NPO >> CLD  Baseline INR > 10, aPTT 88 Hgb 10.2 >> 9.3 >> 9.6  Goal of Therapy:  INR 2-3  Plan:  Warfarin:  --Hold warfarin tonight per discussion with provider given possible need for biopsy of gallbladder mass --Continue to check daily INR --Tentative plan to continue with heparin bridging until INR at goal for two consecutive daily readings given history of CVA  Heparin: --Start heparin infusion at 800 units/hr, no bolus given recent  procedure --Check HL 8 hours from initiation of infusion. Will re-check an aPTT as well --Daily CBC per protocol while on IV heparin; H&H stable  [name] 11/30/2022,8:28 AM

## 2022-11-30 NOTE — Progress Notes (Signed)
PROGRESS NOTE    Ana Herrera   [id number] DOB: [birth year]  DOA: 11/28/2022 Date of Service: 11/30/22 PCP: Valera Castle, MD     Brief Narrative / Hospital Course:  Ana Herrera is a 67 y.o. female with medical history significant of hypertension, stroke without deficit, gout, tobacco abuse, alcohol abuse in remission, atrial fibrillation on Coumadin, RLS, SAH, UTI, brain aneurysm 1.9, with clip repair per her son), who presents to ED from home on 11/28/22 with abdominal pain x1 month and altered mental status. Recent tx UTI w/ augmentin, UTI sx resolved. Worsening abd pain several days PTA, also developed jaundice and confusion.  02/25:  WBC 30.5, lactic acid 1.9, abnormal liver function (ALP 345, AST 87, ALT 45, total protein 6.4), ammonia 15, INR> 10, lipase 32, AKI with creatinine 1.68, BUN 21, GFR 33 (baseline creatinine 0.7 on 05/21/2019), temperature normal, blood pressure 96/62, heart rate 112, 92, RR 24, oxygen saturation 99% on room air. Pt is admitted to telemetry bed as inpatient. Abn RUQ [place] concerning for intraluminal gallbladder mass, biliary ductal dilatation w/ 5-6 mm stone in proximal CBD. Dr. Haig Prophet for GI was consulted --> plan ERCP 02/26: Leukocytosis some improved, hyponatremia slightly worse, AKI/Cr same, ALP and AST/ALT some improved, lower Hgb likely hemodilutional. ERCP today --> biliary stent placed 02/27: AST/ALT improving, Cr about same at 1.66 and GFR 34. Significant confusion overnight requiring sedating meds. Continue to monitor through today. Awaiting recs from IR re: if able to biopsy GB/haptic mass vs need for further imaging first vs gen surg consult. CLD today, advancing per GI. On heparin in case procedure needed, can transition back onto coumadin if nothing more invasive is planned   Consultants:  Gastroenterology   Procedures: 11/29/22 ERCP w/ placement biliary stent       ASSESSMENT & PLAN:   Principal Problem:   Abdominal  pain Active Problems:   Choledocholithiasis   Abnormal LFTs   HTN (hypertension)   AKI (acute kidney injury) ([place])   Stroke ([place])   Atrial fibrillation, chronic (H[facility])   Supratherapeutic INR   Microcytic anemia   Hypokalemia   Hypomagnesemia   Cholangitis   Tobacco abuse   Hyponatremia   Elevated alkaline phosphatase level   Leukocytosis   Nodule of middle lobe of right lung  Abdominal pain d/t choledocholithiasis - improved Abnormal LFTs - improved Meets sepsis criteria POA - brief tachycardia however pt also has Afib, leukocytosis, severe sepsis w/ AKI and AMS - improved  11/29/22 ERCP w/ biliary stent  patient cannot undergo MRCP per MRI. Therefore CT-abd/pelvis without contrast was obtained (w/o contrast due to AKI) --> supports GB/hepatic mass,  IV Zosyn (patient received 1 dose of Rocephin and Flagyl in ED) Blood cultures, no growth thus far  IV fluid: 2 L normal saline, then 100 cc/h As needed morphine for pain CLD per GI for now  Watch for pancreatitis, bleeding, perforation, and cholangitis plan ERCP in 2 mos to remove stent outpatient   Gallbladder/Hepatic mass noted on noncontrast CT  Awaiting recs from IR re: if able to biopsy mass while she is here, vs need for further imaging first w/ contrast once AKI improved vs gen surg consult.   Lung nodule RML 1.9 cm CT chest non-contrast soon but not urgently    Essential HTN Hypotension here likely d/t infection Blood pressure is soft 96/62 Hold lisinopril Continue Cardizem which is for atrial fibrillation IV hydralazine as needed   AKI (acute kidney injury) ([place]):  Likely  due to dehydration and continuation of NSAID and lisinopril Hold Voltaren and lisinopril IV fluid as above Follow BMP   Hyponatremia  Likely d/t liver disease Monitor BMP  Microcytic anemia  Anemia w/u added to labs --> iron deficient Low threshold for transfusion w/ supratherapeutic INR   History of stroke ([place]) Hold warfarin due to  supratherapeutic INR   Atrial fibrillation, chronic ([place]):  heart rate 112 --> 92.  Patient is on Coumadin at home Hold Coumadin due to supratherapeutic INR, and requiring ERCP procedure Heparin for now for CVA prevention  Cardizem for rate    Supratherapeutic INR:  INR> 10, no active bleeding S/p 5 mg of vitamin K following INR --> 1.8 today Consulted pharmacy to manage coumadin, possible restart tomorrow Continue to hold for now given anemia and ERCP    Microcytic anemia:  Hemoglobin 10.2 (11.0 on 03/31/2017) Follow-up with CBC Retic, iron studies    Hypokalemia and hypomagnesemia:  Potassium 2.9, magnesium 1.5.  Phosphorus 3.2 Repleted both potassium and magnesium Monitor labs       Patient seems coherent but not really able to follow the conversation about complex medical situation - question learning disability / mild cognitive impairment? Son appears to understand and appreciate the medical situation. If more complex decision making becomes necessary, would complete more formal assessment of decision-making capacity - at this point patient's goals are to get well and she is eager to undergo procedures and hopefully go home soon. Son is very reasonable and understanding, plan is for her to go back home w/ him on eventual discharge - he is primary caretaker and she lives w/ him.      DVT prophylaxis: SCD, supratherapeutic INR precludes coumadin for now but may restart tomorrow if no further procedures planned, started heparin today  Pertinent IV fluids/nutrition: s/p 2L bolus and now on maintenance fluids, d/c as able  Central lines / invasive devices: none  Code Status: FULL CODE   Current Admission Status: inpatient  TOC needs / Dispo plan: TBD Barriers to discharge / significant pending items: pending ERCP, remain on IV abx pending culture results              Subjective / Brief ROS:  Patient resting comfortably this morning, son notes sedation w/ meds  needed last ngiht - he is at bedside and reports no concerns.   Family Communication: Son, Aaron Edelman, at bedside on rounds     Objective Findings:  Vitals:   11/29/22 2234 11/29/22 2237 11/30/22 0038 11/30/22 0430  BP: (!) 150/100 (!) 162/118 109/86 111/72  Pulse: (!) 116 (!) 116  (!) 110  Resp: '20 20 20 20  '$ Temp: 97.6 F (36.4 C) 97.6 F (36.4 C) 97.6 F (36.4 C) 97.6 F (36.4 C)  TempSrc:      SpO2: 99% 99%  98%  Weight:      Height:        Intake/Output Summary (Last 24 hours) at 11/30/2022 1104 Last data filed at 11/30/2022 1037 Gross per 24 hour  Intake 2757.63 ml  Output 0 ml  Net 2757.63 ml    Filed Weights   11/28/22 1236 11/29/22 1112  Weight: 56.2 kg 56.2 kg    Examination:  Physical Exam Constitutional:      General: She is not in acute distress. Cardiovascular:     Rate and Rhythm: Normal rate. Rhythm irregular.  Pulmonary:     Effort: Pulmonary effort is normal.     Breath sounds: Normal breath sounds.  Abdominal:     General: Abdomen is flat.     Palpations: Abdomen is soft.  Musculoskeletal:     Right lower leg: No edema.     Left lower leg: No edema.  Skin:    General: Skin is warm and dry.          Scheduled Medications:   nicotine  21 mg Transdermal Daily    Continuous Infusions:  sodium chloride 100 mL/hr at 11/30/22 0615   piperacillin-tazobactam (ZOSYN)  IV 3.375 g (11/30/22 1037)    PRN Medications:  hydrALAZINE, morphine injection  Antimicrobials from admission:  Anti-infectives (From admission, onward)    Start     Dose/Rate Route Frequency Ordered Stop   11/28/22 1700  piperacillin-tazobactam (ZOSYN) IVPB 3.375 g        3.375 g 12.5 mL/hr over 240 Minutes Intravenous Every 8 hours 11/28/22 1659     11/28/22 1400  piperacillin-tazobactam (ZOSYN) IVPB 3.375 g  Status:  Discontinued        3.375 g 100 mL/hr over 30 Minutes Intravenous  Once 11/28/22 1354 11/28/22 1355   11/28/22 1400  cefTRIAXone (ROCEPHIN) 2 g in  sodium chloride 0.9 % 100 mL IVPB        2 g 200 mL/hr over 30 Minutes Intravenous  Once 11/28/22 1355 11/28/22 1503   11/28/22 1400  metroNIDAZOLE (FLAGYL) IVPB 500 mg        500 mg 100 mL/hr over 60 Minutes Intravenous  Once 11/28/22 1355 11/28/22 1418           Data Reviewed:  I have personally reviewed the following...  CBC: Recent Labs  Lab 11/28/22 1237 11/29/22 0336 11/30/22 0917  WBC 30.5* 26.6* 13.1*  HGB 10.2* 9.3* 9.6*  HCT 33.2* 30.5* 31.8*  MCV 71.7* 70.6* 70.5*  PLT 348 304 [id number]    Basic Metabolic Panel: Recent Labs  Lab 11/28/22 1237 11/29/22 0336 11/30/22 0917  NA 133* 131* 135  K 2.9* 3.7 3.6  CL 97* 101 105  CO2 21* 19* 20*  GLUCOSE 136* 100* 86  BUN 21 30* 35*  CREATININE 1.68* 1.68* 1.66*  CALCIUM 8.5* 8.2* 8.5*  MG 1.5* 2.0  --   PHOS 3.2  --   --     GFR: Estimated Creatinine Clearance: 28.8 mL/min (A) (by C-G formula based on SCr of 1.66 mg/dL (H)). Liver Function Tests: Recent Labs  Lab 11/28/22 1237 11/29/22 0336 11/30/22 0917  AST 87* 44* 34  ALT 45* 31 27  ALKPHOS 345* 256* 236*  BILITOT 6.4* 4.6* 3.2*  PROT 6.5 5.4* 5.5*  ALBUMIN 2.6* 2.2* 2.1*    Recent Labs  Lab 11/28/22 1237  LIPASE 32    Recent Labs  Lab 11/28/22 1438  AMMONIA 15    Coagulation Profile: Recent Labs  Lab 11/28/22 1237 11/29/22 0336 11/30/22 0917  INR >10.0* 1.8* 1.5*    Cardiac Enzymes: No results for input(s): "CKTOTAL", "CKMB", "CKMBINDEX", "TROPONINI" in the last 168 hours. BNP (last 3 results) No results for input(s): "PROBNP" in the last 8760 hours. HbA1C: No results for input(s): "HGBA1C" in the last 72 hours. CBG: No results for input(s): "GLUCAP" in the last 168 hours. Lipid Profile: No results for input(s): "CHOL", "HDL", "LDLCALC", "TRIG", "CHOLHDL", "LDLDIRECT" in the last 72 hours. Thyroid Function Tests: No results for input(s): "TSH", "T4TOTAL", "FREET4", "T3FREE", "THYROIDAB" in the last 72 hours. Anemia  Panel: Recent Labs    11/29/22 0335  FERRITIN 126  TIBC 253  IRON 21*  RETICCTPCT 1.1    Most Recent Urinalysis On File:  No results found for: "COLORURINE", "APPEARANCEUR", "LABSPEC", "PHURINE", "GLUCOSEU", "HGBUR", "BILIRUBINUR", "KETONESUR", "PROTEINUR", "UROBILINOGEN", "NITRITE", "LEUKOCYTESUR" Sepsis Labs: '@LABRCNTIP'$ (procalcitonin:4,lacticidven:4) Microbiology: Recent Results (from the past 240 hour(s))  Culture, blood (routine x 2)     Status: None (Preliminary result)   Collection Time: 11/28/22  2:38 PM   Specimen: BLOOD  Result Value Ref Range Status   Specimen Description BLOOD LEFT ANTECUBITAL  Final   Special Requests   Final    BOTTLES DRAWN AEROBIC AND ANAEROBIC Blood Culture adequate volume   Culture   Final    NO GROWTH 2 DAYS Performed at [facility], [address], [place], [place] [address]    Report Status PENDING  Incomplete  Culture, blood (routine x 2)     Status: None (Preliminary result)   Collection Time: 11/28/22  2:38 PM   Specimen: BLOOD  Result Value Ref Range Status   Specimen Description BLOOD RIGHT ANTECUBITAL  Final   Special Requests   Final    BOTTLES DRAWN AEROBIC AND ANAEROBIC Blood Culture adequate volume   Culture   Final    NO GROWTH 2 DAYS Performed at [facility], [address], [place], [place] [address]    Report Status PENDING  Incomplete  Resp panel by RT-PCR (RSV, Flu A&B, Covid) Anterior Nasal Swab     Status: None   Collection Time: 11/28/22  4:54 PM   Specimen: Anterior Nasal Swab  Result Value Ref Range Status   SARS Coronavirus 2 by RT PCR NEGATIVE NEGATIVE Final    Comment: (NOTE) SARS-CoV-2 target nucleic acids are NOT DETECTED.  The SARS-CoV-2 RNA is generally detectable in upper respiratory specimens during the acute phase of infection. The lowest concentration of SARS-CoV-2 viral copies this assay can detect is 138 copies/mL. A negative result does not preclude SARS-Cov-2 infection  and should not be used as the sole basis for treatment or other patient management decisions. A negative result may occur with  improper specimen collection/handling, submission of specimen other than nasopharyngeal swab, presence of viral mutation(s) within the areas targeted by this assay, and inadequate number of viral copies(<138 copies/mL). A negative result must be combined with clinical observations, patient history, and epidemiological information. The expected result is Negative.  Fact Sheet for Patients:  EntrepreneurPulse.com.au  Fact Sheet for Healthcare Providers:  IncredibleEmployment.be  This test is no t yet approved or cleared by the [place] FDA and  has been authorized for detection and/or diagnosis of SARS-CoV-2 by FDA under an Emergency Use Authorization (EUA). This EUA will remain  in effect (meaning this test can be used) for the duration of the COVID-19 declaration under Section 564(b)(1) of the Act, 21 U.S.C.section 360bbb-3(b)(1), unless the authorization is terminated  or revoked sooner.       Influenza A by PCR NEGATIVE NEGATIVE Final   Influenza B by PCR NEGATIVE NEGATIVE Final    Comment: (NOTE) The Xpert Xpress SARS-CoV-2/FLU/RSV plus assay is intended as an aid in the diagnosis of influenza from Nasopharyngeal swab specimens and should not be used as a sole basis for treatment. Nasal washings and aspirates are unacceptable for Xpert Xpress SARS-CoV-2/FLU/RSV testing.  Fact Sheet for Patients: EntrepreneurPulse.com.au  Fact Sheet for Healthcare Providers: IncredibleEmployment.be  This test is not yet approved or cleared by the [place] FDA and has been authorized for detection and/or diagnosis of SARS-CoV-2 by FDA under an Emergency Use Authorization (EUA). This EUA will remain  in effect (meaning this test can be used) for the duration of the COVID-19 declaration  under Section 564(b)(1) of the Act, 21 U.S.C. section 360bbb-3(b)(1), unless the authorization is terminated or revoked.     Resp Syncytial Virus by PCR NEGATIVE NEGATIVE Final    Comment: (NOTE) Fact Sheet for Patients: EntrepreneurPulse.com.au  Fact Sheet for Healthcare Providers: IncredibleEmployment.be  This test is not yet approved or cleared by the [place] FDA and has been authorized for detection and/or diagnosis of SARS-CoV-2 by FDA under an Emergency Use Authorization (EUA). This EUA will remain in effect (meaning this test can be used) for the duration of the COVID-19 declaration under Section 564(b)(1) of the Act, 21 U.S.C. section 360bbb-3(b)(1), unless the authorization is terminated or revoked.  Performed at [facility], [address] [place], [place] [address]       Radiology Studies last 3 days: DG C-Arm 1-60 Min-No Report  Result Date: 11/29/2022 Fluoroscopy was utilized by the requesting physician.  No radiographic interpretation.   CT ABDOMEN PELVIS WO CONTRAST  Result Date: 11/29/2022 CLINICAL DATA:  Intermittent abdominal pain for almost a month. EXAM: CT ABDOMEN AND PELVIS WITHOUT CONTRAST TECHNIQUE: Multidetector CT imaging of the abdomen and pelvis was performed following the standard protocol without IV contrast. RADIATION DOSE REDUCTION: This exam was performed according to the departmental dose-optimization program which includes automated exposure control, adjustment of the mA and/or kV according to patient size and/or use of iterative reconstruction technique. COMPARISON:  Ultrasound 11/28/2022 FINDINGS: Lower chest: Bronchial wall thickening and peribronchovascular atelectasis/scarring in the lower lungs. Associated patchy ground-glass opacities. Irregular nodular opacity in the right middle lobe (series 4/image 2) measures 1.9 x 1.8 cm. Hepatobiliary: Lack of IV contrast limits evaluation. There is  a ill-defined heterogenous appearance of the gallbladder extending into the hepatic hilum. The common bile duct is dilated measuring approximately 13 mm in diameter. Possible mild intrahepatic biliary dilation. Poorly defined hypoattenuating area in the right hepatic lobe (2/17) measuring 8 mm. Pancreas: Unremarkable. No pancreatic ductal dilatation or surrounding inflammatory changes. Spleen: Normal in size without focal abnormality. Adrenals/Urinary Tract: Unremarkable adrenal glands. No urinary calculi or hydronephrosis. Unremarkable bladder. Stomach/Bowel: Normal caliber large and small bowel. Enteric contrast is present within the small bowel and colon normal appendix. Vascular/Lymphatic: Calcified plaque in the aorta. No definite adenopathy. Reproductive: Uterus and bilateral adnexa are unremarkable. Other: No free intraperitoneal air.  No abdominal wall hernia. Musculoskeletal: Body wall edema. No acute fracture or aggressive osseous lesion. Advanced degenerative disc disease in the lumbar spine. IMPRESSION: 1. Ill-defined heterogenous appearance of the gallbladder which is difficult to discern from the adjacent hepatic parenchyma. Dilation of the common bile duct and possible mild intrahepatic biliary dilation. Findings are incompletely evaluated without IV contrast but are concerning for hepatic or gallbladder mass. Further assessment with MRI/MRCP is recommended. 2. Poorly defined hypoattenuating area in the right hepatic lobe measuring 8 mm. Recommend attention on follow-up. 3. Irregular nodular opacity in the right middle lobe measuring up to 1.9 cm. This may be related to chronic scarring/atelectasis. Per Fleischner Society Guidelines, recommend prompt non-contrast Chest CT for further evaluation. Reference: Radiology. 2017; 284(1):228-43. Aortic Atherosclerosis (ICD10-I70.0). Electronically Signed   By: Placido Sou M.D.   On: 11/29/2022 01:42   CT HEAD WO CONTRAST (5MM)  Result Date:  11/28/2022 CLINICAL DATA:  Mental status change, unknown cause Confusion. EXAM: CT HEAD WITHOUT CONTRAST TECHNIQUE: Contiguous axial images were obtained from the base of the skull through the vertex without  intravenous contrast. RADIATION DOSE REDUCTION: This exam was performed according to the departmental dose-optimization program which includes automated exposure control, adjustment of the mA and/or kV according to patient size and/or use of iterative reconstruction technique. COMPARISON:  None Available. FINDINGS: Brain: No acute hemorrhage, evidence of ischemia or subdural/extra-axial collection. Brain volume is normal, no hydrocephalus. Moderate to advanced periventricular and deep white matter hypodensity typical of chronic small vessel ischemic change. Small amount of encephalomalacia adjacent to right-sided aneurysm clip. Remote lacunar infarcts in the right basal ganglia and left caudate. Vascular: Right-sided aneurysm clip. Skull base atherosclerosis. No hyperdense vessel. Skull: Prior right-sided craniotomy.  No fracture or focal lesion. Sinuses/Orbits: Scattered mucosal thickening of ethmoid air cells. Left-sided sphenoid sinus retained secretions. No mastoid effusion. Other: None. IMPRESSION: 1. No acute intracranial abnormality. 2. Prior right-sided craniotomy with aneurysm clip in place. 3. Moderate to advanced chronic small vessel ischemic change. Remote lacunar infarcts in the right basal ganglia and left caudate. Electronically Signed   By: Keith Rake M.D.   On: 11/28/2022 16:54   US ABDOMEN LIMITED RUQ (LIVER/GB)  Result Date: 11/28/2022 CLINICAL DATA:  Right upper quadrant pain.  Jaundice. EXAM: ULTRASOUND ABDOMEN LIMITED RIGHT UPPER QUADRANT COMPARISON:  None Available. FINDINGS: Technically challenging exam, patient had difficulty remaining still. Gallbladder: The gallbladder wall is poorly defined. There is heterogeneous gallbladder contents some of which has shadowing likely  representing intraluminal stones. Difficult to delineate the gallbladder wall from the adjacent hepatic parenchyma. No sonographic Murphy sign noted by sonographer. Common bile duct: Diameter: 11 mm. A stone is seen within the proximal duct measuring 5-6 mm. Liver: There is intrahepatic biliary ductal dilatation. No focal lesion identified. Slight increased echogenicity of the portal triads. Portal vein is patent on color Doppler imaging with normal direction of blood flow towards the liver. Other: No right upper quadrant ascites. IMPRESSION: 1. Abnormal appearance of the gallbladder, heterogeneous gallbladder contents, possibly solid. Poorly defined wall that is indistinguishable from the adjacent liver parenchyma. Findings are suspicious for intraluminal mass. In addition there is some posterior shadowing suggestive of calcifications are stones. Recommend further assessment with MRI. 2. Intra and extrahepatic biliary ductal dilatation. 5-6 mm stone is seen within the proximal common bile duct. Electronically Signed   By: Keith Rake M.D.   On: 11/28/2022 15:40             LOS: 2 days       Emeterio Reeve, DO [facility] 11/30/2022, 11:04 AM    Dictation software may have been used to generate the above note. Typos may occur and escape review in typed/dictated notes. Please contact Dr Sheppard Coil directly for clarity if needed.  Staff may message me via secure chat in [place]  but this may not receive an immediate response,  please page me for urgent matters!  If 7PM-7AM, please contact night coverage [email or web address]

## 2022-11-30 NOTE — TOC CM/SW Note (Signed)
  Transition of Care [facility]) Screening Note   Patient Details  Name: Ana Herrera Date of Birth: [birth year]   [facility]) CM/SW Contact:    Candie Chroman, LCSW Phone Number: 11/30/2022, 1:03 PM    Transition of Care Department [facility]) has reviewed patient and no TOC needs have been identified at this time. We will continue to monitor patient advancement through interdisciplinary progression rounds. If new patient transition needs arise, please place a TOC consult.

## 2022-11-30 NOTE — Progress Notes (Addendum)
Pt presents w/ increased confusion and agitation this shift, pt removed PIV, pt impulsive, not following safety and fall protocols, refuses to return to bed, and became combative to staff attempting to go thru exit doors to "go home". Security notified and on unit. Pt assessed prior to becoming combative and neuro function is WDL, no drift, motor strength and sensation intact, BP elevated, VSS otherwise, on-call notified, on unit to assess pt and several medications ordered and given. See MAR. At this time pt resting in bed, eyes closed, IVF restarted and infusing, bed alarm activated and audible, door open for observation. Bed in lowest position, call bell in reach, will monitor

## 2022-12-01 ENCOUNTER — Inpatient Hospital Stay: Payer: Medicare Other

## 2022-12-01 LAB — CBC
HCT: 29.5 % — ABNORMAL LOW (ref 36.0–46.0)
Hemoglobin: 9 g/dL — ABNORMAL LOW (ref 12.0–15.0)
MCH: 21.3 pg — ABNORMAL LOW (ref 26.0–34.0)
MCHC: 30.5 g/dL (ref 30.0–36.0)
MCV: 69.9 fL — ABNORMAL LOW (ref 80.0–100.0)
Platelets: 238 10*3/uL (ref 150–400)
RBC: 4.22 MIL/uL (ref 3.87–5.11)
RDW: 17.5 % — ABNORMAL HIGH (ref 11.5–15.5)
WBC: 8.2 10*3/uL (ref 4.0–10.5)
nRBC: 0 % (ref 0.0–0.2)

## 2022-12-01 LAB — HEPARIN LEVEL (UNFRACTIONATED)
Heparin Unfractionated: <0.1 IU/mL — ABNORMAL LOW (ref 0.30–0.70)
Heparin Unfractionated: 0.14 IU/mL — ABNORMAL LOW (ref 0.30–0.70)

## 2022-12-01 LAB — COMPREHENSIVE METABOLIC PANEL
ALT: 32 U/L (ref 0–44)
AST: 41 U/L (ref 15–41)
Albumin: 2 g/dL — ABNORMAL LOW (ref 3.5–5.0)
Alkaline Phosphatase: 272 U/L — ABNORMAL HIGH (ref 38–126)
Anion gap: 10 (ref 5–15)
BUN: 29 mg/dL — ABNORMAL HIGH (ref 8–23)
CO2: 19 mmol/L — ABNORMAL LOW (ref 22–32)
Calcium: 8.3 mg/dL — ABNORMAL LOW (ref 8.9–10.3)
Chloride: 109 mmol/L (ref 98–111)
Creatinine, Ser: 1.28 mg/dL — ABNORMAL HIGH (ref 0.44–1.00)
GFR, Estimated: 46 mL/min — ABNORMAL LOW (ref >60)
Glucose, Bld: 76 mg/dL (ref 70–99)
Potassium: 3.8 mmol/L (ref 3.5–5.1)
Sodium: 138 mmol/L (ref 135–145)
Total Bilirubin: 2.9 mg/dL — ABNORMAL HIGH (ref 0.3–1.2)
Total Protein: 5.3 g/dL — ABNORMAL LOW (ref 6.5–8.1)

## 2022-12-01 LAB — PROTIME-INR
INR: 2 — ABNORMAL HIGH (ref 0.8–1.2)
Prothrombin Time: 22.4 seconds — ABNORMAL HIGH (ref 11.4–15.2)

## 2022-12-01 MED ORDER — HEPARIN BOLUS VIA INFUSION
1600.0000 [IU] | Freq: Once | INTRAVENOUS | Status: AC
  Administered 2022-12-01: 1600 [IU] via INTRAVENOUS
  Filled 2022-12-01: qty 1600

## 2022-12-01 MED ORDER — IOHEXOL 300 MG/ML  SOLN
100.0000 mL | Freq: Once | INTRAMUSCULAR | Status: AC | PRN
  Administered 2022-12-01: 80 mL via INTRAVENOUS

## 2022-12-01 NOTE — Progress Notes (Signed)
  PROGRESS NOTE    Ana Herrera  [id number] DOB: 1956-08-21 DOA: 11/28/2022 PCP: Valera Castle, MD  [id number]/[id number]-AA  LOS: 3 days   Brief hospital course:   Assessment & Plan: Ana Herrera is a 66 y.o. female with medical history significant of hypertension, stroke without deficit, gout, tobacco abuse, alcohol abuse in remission, atrial fibrillation on Coumadin, RLS, SAH, UTI, brain aneurysm 1.9, with clip repair per her son), who presents to ED from home on 11/28/22 with abdominal pain x1 month and altered mental status. Recent tx UTI w/ augmentin, UTI sx resolved. Worsening abd pain several days PTA, also developed jaundice and confusion.    Abdominal pain d/t choledocholithiasis S/p 11/29/22 ERCP w/ biliary stent  --cont zosyn  Abnormal LFTs - improved  severe sepsis w/ AKI and AMS - improved    Gallbladder/Hepatic mass noted on noncontrast CT  Per IR, mass not amenable to perc biopsy, rec biopsy with EUS CT scan today, per GI   Lung nodule RML 1.9 cm CT chest non-contrast soon but not urgently    Essential HTN Hypotension here likely d/t infection --hold BP meds   AKI (acute kidney injury) ([place]):  Likely due to dehydration and continuation of NSAID and lisinopril   Hyponatremia    Microcytic anemia  Anemia w/u added to labs --> iron deficient Low threshold for transfusion w/ supratherapeutic INR    History of stroke ([place]) Hold warfarin due to supratherapeutic INR   Atrial fibrillation, chronic ([place]):  heart rate 112 --> 92.  Patient is on Coumadin at home Hold Coumadin due to supratherapeutic INR, and requiring ERCP procedure Heparin for now for CVA prevention    Supratherapeutic INR:  INR> 10, no active bleeding S/p 5 mg of vitamin K --hold warfarin for now until after all procedures are completed   Hypokalemia and hypomagnesemia:  --monitor and replete PRN   DVT prophylaxis: [id number] gtt Code Status: Full code  Family  Communication: son updated at bedside today Level of care: Telemetry Medical Dispo:   The patient is from: home Anticipated d/c is to: home Anticipated d/c date is: 1-2 days   Subjective and Interval History:  Pt denied abdominal pain.  Tolerating clear liquid.   Objective: Vitals:   11/30/22 1555 11/30/22 2128 12/01/22 0747 12/01/22 1622  BP: 110/78 119/87 119/83 119/88  Pulse: 60 (!) 106 (!) 102 100  Resp: 20 20 16 16  $ Temp: (!) 97.1 F (36.2 C) (!) 97.5 F (36.4 C) 97.9 F (36.6 C) 97.8 F (36.6 C)  TempSrc: Axillary Axillary  Oral  SpO2: 96% 98% 100% 100%  Weight:      Height:       No intake or output data in the 24 hours ending 12/01/22 1927 Filed Weights   11/28/22 1236 11/29/22 1112  Weight: 56.2 kg 56.2 kg    Examination:   Constitutional: NAD, AAOx3 HEENT: conjunctivae and lids normal, EOMI CV: No cyanosis.   RESP: normal respiratory effort, on RA Neuro: II - XII grossly intact.   Psych: Normal mood and affect.  Appropriate judgement and reason   Data Reviewed: I have personally reviewed labs and imaging studies  Time spent: 50 minutes  Enzo Bi, MD [facility] If 7PM-7AM, please contact night-coverage 12/01/2022, 7:27 PM

## 2022-12-01 NOTE — Care Management Important Message (Signed)
Important Message  Patient Details  Name: Ana Herrera MRN: [id number] Date of Birth: July 15, 1956   Medicare Important Message Given:  Yes     Dannette Barbara 12/01/2022, 10:24 AM

## 2022-12-01 NOTE — Consult Note (Signed)
[place] for Warfarin / IV heparin Indication: atrial fibrillation / Hx CVA  Patient Measurements: Height: '5\' 4"'$  (162.6 cm) Weight: 56.2 kg (123 lb 14.4 oz) IBW/kg (Calculated) : 54.7 Heparin dosing weight: 56.2 kg  Labs: Recent Labs    11/28/22 1237 11/29/22 0336 11/30/22 0906 11/30/22 0917 11/30/22 2245 12/01/22 0357  HGB 10.2* 9.3*  --  9.6*  --  9.0*  HCT 33.2* 30.5*  --  31.8*  --  29.5*  PLT 348 304  --  299  --  238  APTT 88*  --  33  --   --   --   LABPROT >90.0* 20.7*  --  18.2*  --  22.4*  INR >10.0* 1.8*  --  1.5*  --  2.0*  HEPARINUNFRC  --   --   --   --  <0.10*  --   CREATININE 1.68* 1.68*  --  1.66*  --  1.28*    Estimated Creatinine Clearance: 37.3 mL/min (A) (by C-G formula based on SCr of 1.28 mg/dL (H)).  Medical History: Past Medical History:  Diagnosis Date   Alcohol abuse, in remission    Cerebral aneurysm    Chronic atrial fibrillation (HCC)    Gout    HTN (hypertension)    RLS (restless legs syndrome)    SAH (subarachnoid hemorrhage) (HCC)    Stroke (HCC)    Tobacco abuse    UTI (urinary tract infection)     Medications:  Wafarin 3 mg SuTuFr and 2 mg EOD (TWD: 17 mg)  Patient follows with anticoagulation clinic at [facility] in [place]. Regimen above as reported by them via telephone.  Assessment: 67 y/o F with medical history as above and including Afib / history of stroke on warfarin who is admitted with choledocholithiasis and cholangitis as well as gallbladder mass. Plan for ERCP 2/26. Pharmacy consulted for warfarin management and IV heparin bridge while patient is admitted.   Date INR Plan  2/25 > 10 Vitamin K 5 mg IV  2/26 1.8 HOLD  2/27 1.5 HOLD  2/28 2 HOLD   DDI: Zosyn Albumin low Diet NPO >> CLD  Baseline INR > 10, aPTT 88 Hgb 10.2 >> 9.3 >> 9.6 >> 9  Goal of Therapy:  INR 2-3  Plan:  Warfarin:  --Hold warfarin given possible need for biopsy of gallbladder  mass --Continue to check daily INR; trended up to 2 today --Tentative plan to continue with heparin bridging until INR at goal for two consecutive daily readings given history of CVA  Heparin: --Heparin level is subtherapeutic --Heparin 1600 unit IV bolus and increase heparin infusion to 1200 units/hr --Re-check HL 8 hours from rate change --Daily CBC per protocol while on IV heparin; H&H down-trending slightly but may be dilutional  [name] 12/01/2022,7:43 AM

## 2022-12-02 ENCOUNTER — Other Ambulatory Visit: Payer: Self-pay

## 2022-12-02 ENCOUNTER — Telehealth: Payer: Self-pay

## 2022-12-02 ENCOUNTER — Other Ambulatory Visit: Payer: Self-pay | Admitting: Pharmacy Technician

## 2022-12-02 ENCOUNTER — Other Ambulatory Visit (HOSPITAL_COMMUNITY): Payer: Self-pay

## 2022-12-02 DIAGNOSIS — K8050 Calculus of bile duct without cholangitis or cholecystitis without obstruction: Secondary | ICD-10-CM

## 2022-12-02 LAB — HEPARIN LEVEL (UNFRACTIONATED)
Heparin Unfractionated: 0.16 IU/mL — ABNORMAL LOW (ref 0.30–0.70)
Heparin Unfractionated: 0.36 IU/mL (ref 0.30–0.70)

## 2022-12-02 LAB — CBC
HCT: 29.2 % — ABNORMAL LOW (ref 36.0–46.0)
Hemoglobin: 8.9 g/dL — ABNORMAL LOW (ref 12.0–15.0)
MCH: 21.7 pg — ABNORMAL LOW (ref 26.0–34.0)
MCHC: 30.5 g/dL (ref 30.0–36.0)
MCV: 71 fL — ABNORMAL LOW (ref 80.0–100.0)
Platelets: 257 10*3/uL (ref 150–400)
RBC: 4.11 MIL/uL (ref 3.87–5.11)
RDW: 17.8 % — ABNORMAL HIGH (ref 11.5–15.5)
WBC: 7.6 10*3/uL (ref 4.0–10.5)
nRBC: 0 % (ref 0.0–0.2)

## 2022-12-02 LAB — BASIC METABOLIC PANEL
Anion gap: 8 (ref 5–15)
BUN: 18 mg/dL (ref 8–23)
CO2: 21 mmol/L — ABNORMAL LOW (ref 22–32)
Calcium: 8.5 mg/dL — ABNORMAL LOW (ref 8.9–10.3)
Chloride: 110 mmol/L (ref 98–111)
Creatinine, Ser: 0.99 mg/dL (ref 0.44–1.00)
GFR, Estimated: >60 mL/min (ref >60)
Glucose, Bld: 83 mg/dL (ref 70–99)
Potassium: 3.6 mmol/L (ref 3.5–5.1)
Sodium: 139 mmol/L (ref 135–145)

## 2022-12-02 LAB — MAGNESIUM: Magnesium: 2 mg/dL (ref 1.7–2.4)

## 2022-12-02 LAB — PROTIME-INR
INR: 2.2 — ABNORMAL HIGH (ref 0.8–1.2)
Prothrombin Time: 24.1 seconds — ABNORMAL HIGH (ref 11.4–15.2)

## 2022-12-02 MED ORDER — DILTIAZEM HCL ER COATED BEADS 120 MG PO CP24
240.0000 mg | ORAL_CAPSULE | Freq: Every day | ORAL | Status: DC
  Administered 2022-12-02: 240 mg via ORAL
  Filled 2022-12-02: qty 2

## 2022-12-02 MED ORDER — APIXABAN 5 MG PO TABS: 5.0000 mg | ORAL_TABLET | Freq: Two times a day (BID) | ORAL | Status: DC

## 2022-12-02 MED ORDER — AMOXICILLIN-POT CLAVULANATE 875-125 MG PO TABS
1.0000 | ORAL_TABLET | Freq: Two times a day (BID) | ORAL | 0 refills | Status: AC
  Filled 2022-12-02: qty 28, 14d supply, fill #0

## 2022-12-02 MED ORDER — APIXABAN 5 MG PO TABS
5.0000 mg | ORAL_TABLET | Freq: Two times a day (BID) | ORAL | 2 refills | Status: DC
  Filled 2022-12-02: qty 60, 30d supply, fill #0
  Filled 2022-12-02: qty 120, 60d supply, fill #1
  Filled 2022-12-28: qty 30, 15d supply, fill #1
  Filled 2022-12-28: qty 60, 30d supply, fill #1
  Filled 2023-01-10 – 2023-01-17 (×2): qty 60, 30d supply, fill #2

## 2022-12-02 MED ORDER — HEPARIN BOLUS VIA INFUSION
1600.0000 [IU] | Freq: Once | INTRAVENOUS | Status: DC
  Filled 2022-12-02: qty 1600

## 2022-12-02 MED ORDER — NICOTINE 21 MG/24HR TD PT24
21.0000 mg | MEDICATED_PATCH | Freq: Every day | TRANSDERMAL | 0 refills | Status: AC
  Filled 2022-12-02: qty 28, 28d supply, fill #0

## 2022-12-02 MED ORDER — HEPARIN BOLUS VIA INFUSION
1600.0000 [IU] | Freq: Once | INTRAVENOUS | Status: AC
  Administered 2022-12-02: 1600 [IU] via INTRAVENOUS
  Filled 2022-12-02: qty 1600

## 2022-12-02 MED ORDER — AMOXICILLIN-POT CLAVULANATE 875-125 MG PO TABS
1.0000 | ORAL_TABLET | Freq: Two times a day (BID) | ORAL | Status: DC
  Administered 2022-12-02: 1 via ORAL
  Filled 2022-12-02: qty 1

## 2022-12-02 MED ORDER — LISINOPRIL 20 MG PO TABS: ORAL_TABLET | ORAL | Status: DC

## 2022-12-02 NOTE — Progress Notes (Signed)
Pt discharged per MD order. IV removed. Discharge instructions reviewed with pt. Pt verbalized understanding. All questions answered to pt and her sons satisfaction. Pt taken to personal vehicle by volunteer.

## 2022-12-02 NOTE — Telephone Encounter (Signed)
Request faxed to Dr Kym Groom for procedure/blood thinner clearance

## 2022-12-02 NOTE — Telephone Encounter (Signed)
Has been scheduled.

## 2022-12-02 NOTE — Consult Note (Signed)
SURGICAL CONSULTATION NOTE   HISTORY OF PRESENT ILLNESS (HPI):  67 y.o. female presented to [facility] ED for evaluation of altered mental status and confusion.  Patient who initially presented with altered mental status and confusion.  She was found with elevated white blood cell count, elevated bilirubin and abdominal ultrasound concerning for uterine mass and common bile duct dilation.  MRI was recommended but was not done.  As per ED note the ultrasound confirmed a choledocholithiasis.  She was admitted for management of ascending cholangitis with elevated white blood cell count, abdominal pain, jaundice, and altered mental status.  I personally evaluated the images of the abdominal ultrasound and abdominal CT scan.  After admission she had a CT with pancreas protocol that was also concerning of the prior mass versus abscess.  I personally evaluated the images of the CT scan.  Patient had ERCP and now the white blood cell count and bilirubin has decreasing.  Surgery is consulted by Dr. Billie Ruddy in this context for evaluation and management of gallbladder mass.  PAST MEDICAL HISTORY (PMH):  Past Medical History:  Diagnosis Date   Alcohol abuse, in remission    Cerebral aneurysm    Chronic atrial fibrillation (HCC)    Gout    HTN (hypertension)    RLS (restless legs syndrome)    SAH (subarachnoid hemorrhage) (HCC)    Stroke (HCC)    Tobacco abuse    UTI (urinary tract infection)      PAST SURGICAL HISTORY ([place]):  Past Surgical History:  Procedure Laterality Date   CEREBRAL ANEURYSM REPAIR     with clip per her son   ERCP N/A 11/29/2022   Procedure: ENDOSCOPIC RETROGRADE CHOLANGIOPANCREATOGRAPHY (ERCP);  Surgeon: Lucilla Lame, MD;  Location[facility] ENDOSCOPY;  Service: Endoscopy;  Laterality: N/A;     MEDICATIONS:  Prior to Admission medications   Medication Sig Start Date End Date Taking? Authorizing Provider  diltiazem (CARDIZEM CD) 240 MG 24 hr capsule Take by mouth. 07/21/22 07/21/23  Yes [provider]  lisinopril (ZESTRIL) 20 MG tablet Take 20 mg by mouth daily. 07/21/22 07/21/23 Yes [provider]  warfarin (COUMADIN) 1 MG tablet Take 1-3 mg by mouth daily at 4 PM. 07/21/22 07/21/23 Yes [provider]  warfarin (COUMADIN) 3 MG tablet Take 3 mg by mouth daily at 4 PM. 07/21/22 07/21/23 Yes [provider]  diclofenac (VOLTAREN) 50 MG EC tablet Take 1 tablet by mouth daily. Patient not taking: Reported on 11/28/2022 06/17/21   [provider]     ALLERGIES:  No Known Allergies   SOCIAL HISTORY:  Social History   Socioeconomic History   Marital status: Widowed    Spouse name: Not on file   Number of children: Not on file   Years of education: Not on file   Highest education level: Not on file  Occupational History   Not on file  Tobacco Use   Smoking status: Every Day    Types: Cigarettes   Smokeless tobacco: Never  Vaping Use   Vaping Use: Never used  Substance and Sexual Activity   Alcohol use: Not Currently   Drug use: Never   Sexual activity: Not on file  Other Topics Concern   Not on file  Social History Narrative   Not on file   Social Determinants of Health   Financial Resource Strain: Not on file  Food Insecurity: No Food Insecurity (11/29/2022)   Hunger Vital Sign    Worried About Running Out of Food in  the Last Year: Never true    [place] in the Last Year: Never true  Transportation Needs: No Transportation Needs (11/29/2022)   PRAPARE - Hydrologist (Medical): No    Lack of Transportation (Non-Medical): No  Physical Activity: Not on file  Stress: Not on file  Social Connections: Not on file  Intimate Partner Violence: Not At Risk (11/29/2022)   Humiliation, Afraid, Rape, and Kick questionnaire    Fear of Current or Ex-Partner: No    Emotionally Abused: No    Physically Abused: No    Sexually Abused: No      FAMILY HISTORY:  Family History  Problem  Relation Age of Onset   Hypertension Father    Hypertension Brother      REVIEW OF SYSTEMS:  Constitutional: denies weight loss, fever, chills, or sweats  Eyes: denies any other vision changes, history of eye injury  ENT: denies sore throat, hearing problems  Respiratory: denies shortness of breath, wheezing  Cardiovascular: denies chest pain, palpitations  Gastrointestinal: Positive abdominal pain Genitourinary: denies burning with urination or urinary frequency Musculoskeletal: denies any other joint pains or cramps  Skin: denies any other rashes or skin discolorations  Neurological: denies any other headache, dizziness, weakness.  Positive for altered mental status Psychiatric: denies any other depression, anxiety   All other review of systems were negative   VITAL SIGNS:  Temp:  [97.6 F (36.4 C)-98 F (36.7 C)] 97.6 F (36.4 C) (02/29 0738) Pulse Rate:  [51-100] 90 (02/29 0738) Resp:  [16-20] 20 (02/29 0738) BP: (119-161)/(86-97) 150/91 (02/29 0738) SpO2:  [99 %-100 %] 99 % (02/29 0738)     Height: '5\' 4"'$  (162.6 cm) Weight: 56.2 kg BMI (Calculated): 21.26   INTAKE/OUTPUT:  This shift: No intake/output data recorded.  Last 2 shifts: '@IOLAST2SHIFTS'$ @   PHYSICAL EXAM:  Constitutional:  -- Normal body habitus  -- Awake, alert, and oriented x3  Eyes:  -- Pupils equally round and reactive to light  -- No scleral icterus  Ear, nose, and throat:  -- No jugular venous distension  Pulmonary:  -- No crackles  -- Equal breath sounds bilaterally -- Breathing non-labored at rest Cardiovascular:  -- S1, S2 present  -- No pericardial rubs Gastrointestinal:  -- Abdomen soft, nontender, non-distended, no guarding or rebound tenderness -- No abdominal masses appreciated, pulsatile or otherwise  Musculoskeletal and Integumentary:  -- Wounds: None appreciated -- Extremities: B/L UE and LE FROM, hands and feet warm, no edema  Neurologic:  -- Motor function: intact and  symmetric -- Sensation: intact and symmetric   Labs:     Latest Ref Rng & Units 12/02/2022    4:30 AM 12/01/2022    3:57 AM 11/30/2022    9:17 AM  CBC  WBC 4.0 - 10.5 K/uL 7.6  8.2  13.1   Hemoglobin 12.0 - 15.0 g/dL 8.9  9.0  9.6   Hematocrit 36.0 - 46.0 % 29.2  29.5  31.8   Platelets 150 - 400 K/uL 257  238  299       Latest Ref Rng & Units 12/02/2022    4:30 AM 12/01/2022    3:57 AM 11/30/2022    9:17 AM  CMP  Glucose 70 - 99 mg/dL 83  76  86   BUN 8 - 23 mg/dL 18  29  35   Creatinine 0.44 - 1.00 mg/dL 0.99  1.28  1.66   Sodium 135 - 145 mmol/L 139  138  135   Potassium 3.5 - 5.1 mmol/L 3.6  3.8  3.6   Chloride 98 - 111 mmol/L 110  109  105   CO2 22 - 32 mmol/L '21  19  20   '$ Calcium 8.9 - 10.3 mg/dL 8.5  8.3  8.5   Total Protein 6.5 - 8.1 g/dL  5.3  5.5   Total Bilirubin 0.3 - 1.2 mg/dL  2.9  3.2   Alkaline Phos 38 - 126 U/L  272  236   AST 15 - 41 U/L  41  34   ALT 0 - 44 U/L  32  27     Imaging studies:  EXAM: CT ABDOMEN AND PELVIS WITHOUT AND WITH CONTRAST   TECHNIQUE: Multidetector CT imaging of the abdomen and pelvis was performed following the standard protocol before and following the bolus administration of intravenous contrast.   RADIATION DOSE REDUCTION: This exam was performed according to the departmental dose-optimization program which includes automated exposure control, adjustment of the mA and/or kV according to patient size and/or use of iterative reconstruction technique.   CONTRAST:  92m OMNIPAQUE IOHEXOL 300 MG/ML  SOLN   COMPARISON:  11/28/2022   FINDINGS: Examination is generally limited by breath motion artifact throughout.   Lower chest: Small bilateral pleural effusions and associated atelectasis or consolidation.   Hepatobiliary: Coarse, nodular contour of the liver. Multiple small low-attenuation lesions scattered throughout the liver, largest of which are clearly simple cysts, others too small to characterize although most  likely tiny cysts. No suspicious arterial phase contrast enhancement. No gallstones. Unfortunately, assessment of the gallbladder is somewhat limited by breath motion artifact on today's examination; within this limitation, there is a lobulated, multi septated appearance of the gallbladder fundus with thick, contrast enhancing internal septations. This region measures approximately 3.9 x 3.4 cm (series 8, image 67). Common bile duct stent with post stenting pneumobilia. No biliary ductal dilatation.   Pancreas: Unremarkable. No pancreatic ductal dilatation or surrounding inflammatory changes.   Spleen: Normal in size without significant abnormality.   Adrenals/Urinary Tract: Adrenal glands are unremarkable. Kidneys are normal, without renal calculi, solid lesion, or hydronephrosis. Bladder is unremarkable.   Stomach/Bowel: Stomach is within normal limits. Appendix appears normal. No evidence of bowel wall thickening, distention, or inflammatory changes. Enteric contrast throughout the colon.   Vascular/Lymphatic: Aortic atherosclerosis. No enlarged abdominal or pelvic lymph nodes.   Reproductive: No mass or other significant abnormality.   Other: No abdominal wall hernia or abnormality. Small volume ascites throughout the abdomen and pelvis.   Musculoskeletal: No acute or significant osseous findings.   IMPRESSION: 1. Unfortunately, assessment of the gallbladder is somewhat limited by breath motion artifact on today's examination; within this limitation, there is a lobulated, multiseptated appearance of the gallbladder fundus with thick, contrast enhancing internal septations. This finding measures approximately 3.9 x 3.4 cm. Differential considerations include both gallbladder neoplasm and infectious or inflammatory etiology such as gallbladder abscess. 2. Common bile duct stent with post stenting pneumobilia. No biliary ductal dilatation. 3. Cirrhotic morphology of the  liver. Multiple small low-attenuation lesions scattered throughout the liver, largest of which are clearly simple cysts, others too small to characterize although most likely tiny cysts. No suspicious arterial phase contrast enhancement. 4. Small volume ascites throughout the abdomen and pelvis. 5. Small bilateral pleural effusions and associated atelectasis or consolidation.   Aortic Atherosclerosis (ICD10-I70.0).     Electronically Signed   By: ADelanna AhmadiM.D.   On: 12/01/2022 15:33  Assessment/Plan:  67 y.o. female with ascending colon diuresis status post ERCP, complicated by pertinent comorbidities including suspected gallbladder wall mass versus abscess, A-fib on anticoagulation, hypertension.  Patient initially admitted with ascending cholangitis with altered mental status, elevated white blood cell count, elevated bilirubin, abdominal pain.  She was treated with antibiotic therapy and ERCP with stent placement.  Since the ERCP with stent placement the white blood cell has been decreasing adequately now 7.6 (30.5 from admission).  The bilirubin is also in decreasing trend.  This shows adequate response to antibiotic therapy and decompression with ERCP and stent placement.  Regarding the gallbladder mass versus abscess due to concern of possible gallbladder mass I think that this patient should be evaluated by hepatobiliary surgeon.  Patient will benefit of cholecystectomy after choledocholithiasis.  If there is actually a mass and patient needs more extensive liver surgery during same surgery she will be better treated by liver surgeon.  I had the discussion with the patient and the family and they agree with plan.  All of the above findings and recommendations were discussed with the patient and her family, and all of patient's and her family's questions were answered to their expressed satisfaction.  Arnold Long, MD

## 2022-12-02 NOTE — Progress Notes (Signed)
Patient is seen by a provider at [facility].  Explained that [place] at [facility] only does PAP for patients who are seeing a [facility] provider.  Provided patient a blank copy of an Eliquis PAP application.  Patient has an appontment with provider on 12/07/22.  Patient to work with provider to complete application.  Patient to give provider proof of income and work with provider to send to [employer].  Jacquelynn Cree Patient Advocate Specialist [place] at [facility]

## 2022-12-02 NOTE — Consult Note (Signed)
[place] for Warfarin / IV heparin Indication: atrial fibrillation / Hx CVA  Patient Measurements: Height: '5\' 4"'$  (162.6 cm) Weight: 56.2 kg (123 lb 14.4 oz) IBW/kg (Calculated) : 54.7 Heparin dosing weight: 56.2 kg  Labs: Recent Labs    11/29/22 0336 11/30/22 0906 11/30/22 0917 11/30/22 2245 12/01/22 0357 12/01/22 0942 12/01/22 2106 12/02/22 0025  HGB 9.3*  --  9.6*  --  9.0*  --   --   --   HCT 30.5*  --  31.8*  --  29.5*  --   --   --   PLT 304  --  299  --  238  --   --   --   APTT  --  33  --   --   --   --   --   --   LABPROT 20.7*  --  18.2*  --  22.4*  --   --   --   INR 1.8*  --  1.5*  --  2.0*  --   --   --   HEPARINUNFRC  --   --   --    < >  --  <0.10* 0.14* 0.16*  CREATININE 1.68*  --  1.66*  --  1.28*  --   --   --    < > = values in this interval not displayed.    Estimated Creatinine Clearance: 37.3 mL/min (A) (by C-G formula based on SCr of 1.28 mg/dL (H)).  Medical History: Past Medical History:  Diagnosis Date   Alcohol abuse, in remission    Cerebral aneurysm    Chronic atrial fibrillation (HCC)    Gout    HTN (hypertension)    RLS (restless legs syndrome)    SAH (subarachnoid hemorrhage) (HCC)    Stroke (HCC)    Tobacco abuse    UTI (urinary tract infection)     Medications:  Wafarin 3 mg SuTuFr and 2 mg EOD (TWD: 17 mg)  Patient follows with anticoagulation clinic at [facility] in [place]. Regimen above as reported by them via telephone.  Assessment: 67 y/o F with medical history as above and including Afib / history of stroke on warfarin who is admitted with choledocholithiasis and cholangitis as well as gallbladder mass. Plan for ERCP 2/26. Pharmacy consulted for warfarin management and IV heparin bridge while patient is admitted.   Date INR Plan  2/25 > 10 Vitamin K 5 mg IV  2/26 1.8 HOLD  2/27 1.5 HOLD  2/28 2 HOLD   DDI: Zosyn Albumin low Diet NPO >> CLD  Baseline INR > 10, aPTT  88 Hgb 10.2 >> 9.3 >> 9.6 >> 9  Goal of Therapy:  INR 2-3  Plan:  Warfarin:  --Hold warfarin given possible need for biopsy of gallbladder mass --Continue to check daily INR; trended up to 2 today --Tentative plan to continue with heparin bridging until INR at goal for two consecutive daily readings given history of CVA  Heparin: 2/28:  HL @ 2106 = 0.14, SUBtherapeutic, repeat HL ordered 2/29:  HL @ 0025 = 0.16, SUBtherapeutic  - Will order heparin 1600 units IV X 1 and increase drip rate to 1400 units/hr.  ----Re-check HL 8 hours from rate change --Daily CBC per protocol while on IV heparin; H&H down-trending slightly but may be dilutional  Ana Herrera D 12/02/2022,1:05 AM

## 2022-12-02 NOTE — Discharge Summary (Signed)
Physician Discharge Summary   Ana Herrera  female DOB: [birth year]  [id number]  PCP: Valera Castle, MD  Admit date: 11/28/2022 Discharge date: 12/02/2022  Admitted From: home Disposition:  home Son updated at bedside prior to discharge. CODE STATUS: Full code  Discharge Instructions     Ambulatory referral to General Surgery   Complete by: As directed    Referral needed for hepatobiliary surgeon   Diet - low sodium heart healthy   Complete by: As directed    Discharge instructions   Complete by: As directed    I have switched you from warfarin to Eliquis.  Please start taking tomorrow 5 mg twice daily.  Please finish 14 more days of oral antibiotic Augmentin as directed.  You have been referred to see Dr. Stark Klein, who is gallbladder and liver surgeon.     Dr. Enzo Bi [facility] Course:  For full details, please see H&P, progress notes, consult notes and ancillary notes.  Briefly,  Ana Herrera is a 67 y.o. female with medical history significant of hypertension, stroke without deficit, tobacco abuse, alcohol abuse in remission, atrial fibrillation on Coumadin, SAH, brain aneurysm with clip repair per her son, who presented to ED from home on 11/28/22 with abdominal pain x1 month and altered mental status. Recent tx UTI w/ augmentin, UTI sx resolved. Worsening abd pain several days PTA, also developed jaundice and confusion.    Abdominal pain d/t choledocholithiasis S/p 11/29/22 ERCP w/ biliary stent  --Pt received zosyn during hospitalization, and was discharged on 14 more days of Augmentin. --outpatient ERCP scheduled for stent removal on 4/30 --Per guideline, pt should receive hospital cholecystectomy, however, due to gallbladder mass, pt was referred to hepatobiliary surgeon.  Gallbladder mass --CT w contrast showed "a lobulated, multiseptated appearance of the gallbladder fundus with thick, contrast enhancing internal  septations. This finding measures approximately 3.9 x 3.4 cm.  Differential considerations include both gallbladder neoplasm and infectious or inflammatory etiology such as gallbladder abscess." --GenSurg consulted, who rec referral to hepatobiliary surgeon for cholecystectomy in case liver surgery/resection is needed.  Pt and family chose to see a hepatobiliary surgeon with [facility], so pt was referred to see Dr. Stark Klein. --Pt was not showing signs of infection, however, given the possibility of abscess, pt was discharged on 14 more days of Augmentin.   Essential HTN Hypotension here likely d/t infection, so home BP meds held. --home Cardizem resumed prior to discharge. --Home Lisinopril held pending outpatient f/u due to soft BP and AKI.   AKI (acute kidney injury) ([place]):  Likely due to dehydration and sepsis. --Cr 1.68 on presentation, improved to 0.99 prior to discharge.   Atrial fibrillation, chronic ([place]):  Patient was on Coumadin at home --Held Coumadin due to supratherapeutic INR, and requiring ERCP procedure --pt was maintained on heparin gtt during hospitalization.  Home anticoagulation switched to Eliquis prior to discharge. --home Cardizem resumed prior to discharge.   Supratherapeutic INR:  INR> 10 on presentation, no active bleeding S/p 5 mg of vitamin K --warfarin held and pt was maintained on heparin gtt during hospitalization. --anticoagulation switched to Eliquis prior to discharge.   Hypokalemia and hypomagnesemia:  --monitored and repleted PRN  Hyponatremia, mild, resolved   Microcytic anemia  Iron def  History of stroke (HCC)  Abnormal LFTs - improved   Severe sepsis w/ AKI and AMS - improved    Lung nodule RML 1.9 cm --Outpatient CT chest non-contrast  soon but not urgently    Discharge Diagnoses:  Principal Problem:   Abdominal pain Active Problems:   Choledocholithiasis   Abnormal LFTs   HTN (hypertension)   AKI (acute kidney injury) (HCC)    Stroke (HCC)   Atrial fibrillation, chronic (HCC)   Supratherapeutic INR   Microcytic anemia   Hypokalemia   Hypomagnesemia   Cholangitis   Tobacco abuse   Hyponatremia   Elevated alkaline phosphatase level   Leukocytosis   Nodule of middle lobe of right lung   Jaundice   30 Day Unplanned Readmission Risk Score    Flowsheet Row ED to Hosp-Admission (Current) from 11/28/2022 in [place]  30 Day Unplanned Readmission Risk Score (%) 10.32 Filed at 12/02/2022 0801       This score is the patient's risk of an unplanned readmission within 30 days of being discharged (0 -100%). The score is based on dignosis, age, lab data, medications, orders, and past utilization.   Low:  0-14.9   Medium: 15-21.9   High: 22-29.9   Extreme: 30 and above         Discharge Instructions:  Allergies as of 12/02/2022   No Known Allergies      Medication List     STOP taking these medications    diclofenac 50 MG EC tablet Commonly known as: VOLTAREN   warfarin 1 MG tablet Commonly known as: COUMADIN   warfarin 3 MG tablet Commonly known as: COUMADIN       TAKE these medications    amoxicillin-clavulanate 875-125 MG tablet Commonly known as: AUGMENTIN Take 1 tablet by mouth every 12 (twelve) hours for 14 days.   apixaban 5 MG Tabs tablet Commonly known as: ELIQUIS Take 1 tablet (5 mg total) by mouth 2 (two) times daily. Start taking on: December 03, 2022   diltiazem 240 MG 24 hr capsule Commonly known as: CARDIZEM CD Take by mouth.   lisinopril 20 MG tablet Commonly known as: ZESTRIL Hold until followup with primary care doctor due to low blood pressure and acute kidney injury. What changed:  how much to take how to take this when to take this additional instructions   nicotine 21 mg/24hr patch Commonly known as: NICODERM CQ - dosed in mg/24 hours Place 1 patch (21 mg total) onto the skin daily. Start taking on: December 03, 2022          Follow-up Information     Olmedo, Guy Begin, MD Follow up in 1 week(s).   Specialty: Family Medicine Contact information: [place] [address] [phone]         Lucilla Lame, MD Follow up in 2 month(s).   Specialty: Gastroenterology Contact information: [place] [address] [phone]         Stark Klein, MD Follow up in 1 week(s).   Specialty: General Surgery Why: cholecystectomy (gallbladder removal) Contact information: [place] [address] [phone]                 No Known Allergies   The results of significant diagnostics from this hospitalization (including imaging, microbiology, ancillary and laboratory) are listed below for reference.   Consultations:   Procedures/Studies: CT ABDOMEN PELVIS W WO CONTRAST  Result Date: 12/01/2022 CLINICAL DATA:  Gallbladder mass * Tracking Code: BO * EXAM: CT ABDOMEN AND PELVIS WITHOUT AND WITH CONTRAST TECHNIQUE: Multidetector CT imaging of the abdomen and pelvis was performed following  the standard protocol before and following the bolus administration of intravenous contrast. RADIATION DOSE REDUCTION: This exam was performed according to the departmental dose-optimization program which includes automated exposure control, adjustment of the mA and/or kV according to patient size and/or use of iterative reconstruction technique. CONTRAST:  38m OMNIPAQUE IOHEXOL 300 MG/ML  SOLN COMPARISON:  11/28/2022 FINDINGS: Examination is generally limited by breath motion artifact throughout. Lower chest: Small bilateral pleural effusions and associated atelectasis or consolidation. Hepatobiliary: Coarse, nodular contour of the liver. Multiple small low-attenuation lesions scattered throughout the liver, largest of which are clearly simple cysts, others too small to characterize although most likely tiny cysts. No suspicious arterial phase contrast  enhancement. No gallstones. Unfortunately, assessment of the gallbladder is somewhat limited by breath motion artifact on today's examination; within this limitation, there is a lobulated, multi septated appearance of the gallbladder fundus with thick, contrast enhancing internal septations. This region measures approximately 3.9 x 3.4 cm (series 8, image 67). Common bile duct stent with post stenting pneumobilia. No biliary ductal dilatation. Pancreas: Unremarkable. No pancreatic ductal dilatation or surrounding inflammatory changes. Spleen: Normal in size without significant abnormality. Adrenals/Urinary Tract: Adrenal glands are unremarkable. Kidneys are normal, without renal calculi, solid lesion, or hydronephrosis. Bladder is unremarkable. Stomach/Bowel: Stomach is within normal limits. Appendix appears normal. No evidence of bowel wall thickening, distention, or inflammatory changes. Enteric contrast throughout the colon. Vascular/Lymphatic: Aortic atherosclerosis. No enlarged abdominal or pelvic lymph nodes. Reproductive: No mass or other significant abnormality. Other: No abdominal wall hernia or abnormality. Small volume ascites throughout the abdomen and pelvis. Musculoskeletal: No acute or significant osseous findings. IMPRESSION: 1. Unfortunately, assessment of the gallbladder is somewhat limited by breath motion artifact on today's examination; within this limitation, there is a lobulated, multiseptated appearance of the gallbladder fundus with thick, contrast enhancing internal septations. This finding measures approximately 3.9 x 3.4 cm. Differential considerations include both gallbladder neoplasm and infectious or inflammatory etiology such as gallbladder abscess. 2. Common bile duct stent with post stenting pneumobilia. No biliary ductal dilatation. 3. Cirrhotic morphology of the liver. Multiple small low-attenuation lesions scattered throughout the liver, largest of which are clearly simple cysts,  others too small to characterize although most likely tiny cysts. No suspicious arterial phase contrast enhancement. 4. Small volume ascites throughout the abdomen and pelvis. 5. Small bilateral pleural effusions and associated atelectasis or consolidation. Aortic Atherosclerosis (ICD10-I70.0). Electronically Signed   By: ADelanna AhmadiM.D.   On: 12/01/2022 15:33   DG C-Arm 1-60 Min-No Report  Result Date: 11/29/2022 Fluoroscopy was utilized by the requesting physician.  No radiographic interpretation.   CT ABDOMEN PELVIS WO CONTRAST  Result Date: 11/29/2022 CLINICAL DATA:  Intermittent abdominal pain for almost a month. EXAM: CT ABDOMEN AND PELVIS WITHOUT CONTRAST TECHNIQUE: Multidetector CT imaging of the abdomen and pelvis was performed following the standard protocol without IV contrast. RADIATION DOSE REDUCTION: This exam was performed according to the departmental dose-optimization program which includes automated exposure control, adjustment of the mA and/or kV according to patient size and/or use of iterative reconstruction technique. COMPARISON:  Ultrasound 11/28/2022 FINDINGS: Lower chest: Bronchial wall thickening and peribronchovascular atelectasis/scarring in the lower lungs. Associated patchy ground-glass opacities. Irregular nodular opacity in the right middle lobe (series 4/image 2) measures 1.9 x 1.8 cm. Hepatobiliary: Lack of IV contrast limits evaluation. There is a ill-defined heterogenous appearance of the gallbladder extending into the hepatic hilum. The common bile duct is dilated measuring approximately 13 mm in diameter. Possible mild intrahepatic biliary  dilation. Poorly defined hypoattenuating area in the right hepatic lobe (2/17) measuring 8 mm. Pancreas: Unremarkable. No pancreatic ductal dilatation or surrounding inflammatory changes. Spleen: Normal in size without focal abnormality. Adrenals/Urinary Tract: Unremarkable adrenal glands. No urinary calculi or hydronephrosis.  Unremarkable bladder. Stomach/Bowel: Normal caliber large and small bowel. Enteric contrast is present within the small bowel and colon normal appendix. Vascular/Lymphatic: Calcified plaque in the aorta. No definite adenopathy. Reproductive: Uterus and bilateral adnexa are unremarkable. Other: No free intraperitoneal air.  No abdominal wall hernia. Musculoskeletal: Body wall edema. No acute fracture or aggressive osseous lesion. Advanced degenerative disc disease in the lumbar spine. IMPRESSION: 1. Ill-defined heterogenous appearance of the gallbladder which is difficult to discern from the adjacent hepatic parenchyma. Dilation of the common bile duct and possible mild intrahepatic biliary dilation. Findings are incompletely evaluated without IV contrast but are concerning for hepatic or gallbladder mass. Further assessment with MRI/MRCP is recommended. 2. Poorly defined hypoattenuating area in the right hepatic lobe measuring 8 mm. Recommend attention on follow-up. 3. Irregular nodular opacity in the right middle lobe measuring up to 1.9 cm. This may be related to chronic scarring/atelectasis. Per Fleischner Society Guidelines, recommend prompt non-contrast Chest CT for further evaluation. Reference: Radiology. 2017; 284(1):228-43. Aortic Atherosclerosis (ICD10-I70.0). Electronically Signed   By: Placido Sou M.D.   On: 11/29/2022 01:42   CT HEAD WO CONTRAST (5MM)  Result Date: 11/28/2022 CLINICAL DATA:  Mental status change, unknown cause Confusion. EXAM: CT HEAD WITHOUT CONTRAST TECHNIQUE: Contiguous axial images were obtained from the base of the skull through the vertex without intravenous contrast. RADIATION DOSE REDUCTION: This exam was performed according to the departmental dose-optimization program which includes automated exposure control, adjustment of the mA and/or kV according to patient size and/or use of iterative reconstruction technique. COMPARISON:  None Available. FINDINGS: Brain: No acute  hemorrhage, evidence of ischemia or subdural/extra-axial collection. Brain volume is normal, no hydrocephalus. Moderate to advanced periventricular and deep white matter hypodensity typical of chronic small vessel ischemic change. Small amount of encephalomalacia adjacent to right-sided aneurysm clip. Remote lacunar infarcts in the right basal ganglia and left caudate. Vascular: Right-sided aneurysm clip. Skull base atherosclerosis. No hyperdense vessel. Skull: Prior right-sided craniotomy.  No fracture or focal lesion. Sinuses/Orbits: Scattered mucosal thickening of ethmoid air cells. Left-sided sphenoid sinus retained secretions. No mastoid effusion. Other: None. IMPRESSION: 1. No acute intracranial abnormality. 2. Prior right-sided craniotomy with aneurysm clip in place. 3. Moderate to advanced chronic small vessel ischemic change. Remote lacunar infarcts in the right basal ganglia and left caudate. Electronically Signed   By: Keith Rake M.D.   On: 11/28/2022 16:54   US ABDOMEN LIMITED RUQ (LIVER/GB)  Result Date: 11/28/2022 CLINICAL DATA:  Right upper quadrant pain.  Jaundice. EXAM: ULTRASOUND ABDOMEN LIMITED RIGHT UPPER QUADRANT COMPARISON:  None Available. FINDINGS: Technically challenging exam, patient had difficulty remaining still. Gallbladder: The gallbladder wall is poorly defined. There is heterogeneous gallbladder contents some of which has shadowing likely representing intraluminal stones. Difficult to delineate the gallbladder wall from the adjacent hepatic parenchyma. No sonographic Murphy sign noted by sonographer. Common bile duct: Diameter: 11 mm. A stone is seen within the proximal duct measuring 5-6 mm. Liver: There is intrahepatic biliary ductal dilatation. No focal lesion identified. Slight increased echogenicity of the portal triads. Portal vein is patent on color Doppler imaging with normal direction of blood flow towards the liver. Other: No right upper quadrant ascites.  IMPRESSION: 1. Abnormal appearance of the gallbladder, heterogeneous gallbladder contents,  possibly solid. Poorly defined wall that is indistinguishable from the adjacent liver parenchyma. Findings are suspicious for intraluminal mass. In addition there is some posterior shadowing suggestive of calcifications are stones. Recommend further assessment with MRI. 2. Intra and extrahepatic biliary ductal dilatation. 5-6 mm stone is seen within the proximal common bile duct. Electronically Signed   By: Keith Rake M.D.   On: 11/28/2022 15:40      Labs: BNP (last 3 results) No results for input(s): "BNP" in the last 8760 hours. Basic Metabolic Panel: Recent Labs  Lab 11/28/22 1237 11/29/22 0336 11/30/22 0917 12/01/22 0357 12/02/22 0430  NA 133* 131* 135 138 139  K 2.9* 3.7 3.6 3.8 3.6  CL 97* 101 105 109 110  CO2 21* 19* 20* 19* 21*  GLUCOSE 136* 100* 86 76 83  BUN 21 30* 35* 29* 18  CREATININE 1.68* 1.68* 1.66* 1.28* 0.99  CALCIUM 8.5* 8.2* 8.5* 8.3* 8.5*  MG 1.5* 2.0  --   --  2.0  PHOS 3.2  --   --   --   --    Liver Function Tests: Recent Labs  Lab 11/28/22 1237 11/29/22 0336 11/30/22 0917 12/01/22 0357  AST 87* 44* 34 41  ALT 45* 31 27 32  ALKPHOS 345* 256* 236* 272*  BILITOT 6.4* 4.6* 3.2* 2.9*  PROT 6.5 5.4* 5.5* 5.3*  ALBUMIN 2.6* 2.2* 2.1* 2.0*   Recent Labs  Lab 11/28/22 1237  LIPASE 32   Recent Labs  Lab 11/28/22 1438  AMMONIA 15   CBC: Recent Labs  Lab 11/28/22 1237 11/29/22 0336 11/30/22 0917 12/01/22 0357 12/02/22 0430  WBC 30.5* 26.6* 13.1* 8.2 7.6  HGB 10.2* 9.3* 9.6* 9.0* 8.9*  HCT 33.2* 30.5* 31.8* 29.5* 29.2*  MCV 71.7* 70.6* 70.5* 69.9* 71.0*  PLT 348 304 299 238 257   Cardiac Enzymes: No results for input(s): "CKTOTAL", "CKMB", "CKMBINDEX", "TROPONINI" in the last 168 hours. BNP: Invalid input(s): "POCBNP" CBG: No results for input(s): "GLUCAP" in the last 168 hours. D-Dimer No results for input(s): "DDIMER" in the last 72  hours. Hgb A1c No results for input(s): "HGBA1C" in the last 72 hours. Lipid Profile No results for input(s): "CHOL", "HDL", "LDLCALC", "TRIG", "CHOLHDL", "LDLDIRECT" in the last 72 hours. Thyroid function studies No results for input(s): "TSH", "T4TOTAL", "T3FREE", "THYROIDAB" in the last 72 hours.  Invalid input(s): "FREET3" Anemia work up No results for input(s): "VITAMINB12", "FOLATE", "FERRITIN", "TIBC", "IRON", "RETICCTPCT" in the last 72 hours. Urinalysis No results found for: "COLORURINE", "APPEARANCEUR", "LABSPEC", "PHURINE", "GLUCOSEU", "HGBUR", "BILIRUBINUR", "KETONESUR", "PROTEINUR", "UROBILINOGEN", "NITRITE", "LEUKOCYTESUR" Sepsis Labs Recent Labs  Lab 11/29/22 0336 11/30/22 0917 12/01/22 0357 12/02/22 0430  WBC 26.6* 13.1* 8.2 7.6   Microbiology Recent Results (from the past 240 hour(s))  Culture, blood (routine x 2)     Status: None (Preliminary result)   Collection Time: 11/28/22  2:38 PM   Specimen: BLOOD  Result Value Ref Range Status   Specimen Description BLOOD LEFT ANTECUBITAL  Final   Special Requests   Final    BOTTLES DRAWN AEROBIC AND ANAEROBIC Blood Culture adequate volume   Culture   Final    NO GROWTH 4 DAYS Performed at [facility], [address] [place], [place] [address]    Report Status PENDING  Incomplete  Culture, blood (routine x 2)     Status: None (Preliminary result)   Collection Time: 11/28/22  2:38 PM   Specimen: BLOOD  Result Value Ref Range Status   Specimen  Description BLOOD RIGHT ANTECUBITAL  Final   Special Requests   Final    BOTTLES DRAWN AEROBIC AND ANAEROBIC Blood Culture adequate volume   Culture   Final    NO GROWTH 4 DAYS Performed at [facility], [address], [place], [place] [address]    Report Status PENDING  Incomplete  Resp panel by RT-PCR (RSV, Flu A&B, Covid) Anterior Nasal Swab     Status: None   Collection Time: 11/28/22  4:54 PM   Specimen: Anterior Nasal Swab  Result Value Ref  Range Status   SARS Coronavirus 2 by RT PCR NEGATIVE NEGATIVE Final    Comment: (NOTE) SARS-CoV-2 target nucleic acids are NOT DETECTED.  The SARS-CoV-2 RNA is generally detectable in upper respiratory specimens during the acute phase of infection. The lowest concentration of SARS-CoV-2 viral copies this assay can detect is 138 copies/mL. A negative result does not preclude SARS-Cov-2 infection and should not be used as the sole basis for treatment or other patient management decisions. A negative result may occur with  improper specimen collection/handling, submission of specimen other than nasopharyngeal swab, presence of viral mutation(s) within the areas targeted by this assay, and inadequate number of viral copies(<138 copies/mL). A negative result must be combined with clinical observations, patient history, and epidemiological information. The expected result is Negative.  Fact Sheet for Patients:  EntrepreneurPulse.com.au  Fact Sheet for Healthcare Providers:  IncredibleEmployment.be  This test is no t yet approved or cleared by the [place] FDA and  has been authorized for detection and/or diagnosis of SARS-CoV-2 by FDA under an Emergency Use Authorization (EUA). This EUA will remain  in effect (meaning this test can be used) for the duration of the COVID-19 declaration under Section 564(b)(1) of the Act, 21 U.S.C.section 360bbb-3(b)(1), unless the authorization is terminated  or revoked sooner.       Influenza A by PCR NEGATIVE NEGATIVE Final   Influenza B by PCR NEGATIVE NEGATIVE Final    Comment: (NOTE) The Xpert Xpress SARS-CoV-2/FLU/RSV plus assay is intended as an aid in the diagnosis of influenza from Nasopharyngeal swab specimens and should not be used as a sole basis for treatment. Nasal washings and aspirates are unacceptable for Xpert Xpress SARS-CoV-2/FLU/RSV testing.  Fact Sheet for  Patients: EntrepreneurPulse.com.au  Fact Sheet for Healthcare Providers: IncredibleEmployment.be  This test is not yet approved or cleared by the [place] FDA and has been authorized for detection and/or diagnosis of SARS-CoV-2 by FDA under an Emergency Use Authorization (EUA). This EUA will remain in effect (meaning this test can be used) for the duration of the COVID-19 declaration under Section 564(b)(1) of the Act, 21 U.S.C. section 360bbb-3(b)(1), unless the authorization is terminated or revoked.     Resp Syncytial Virus by PCR NEGATIVE NEGATIVE Final    Comment: (NOTE) Fact Sheet for Patients: EntrepreneurPulse.com.au  Fact Sheet for Healthcare Providers: IncredibleEmployment.be  This test is not yet approved or cleared by the [place] FDA and has been authorized for detection and/or diagnosis of SARS-CoV-2 by FDA under an Emergency Use Authorization (EUA). This EUA will remain in effect (meaning this test can be used) for the duration of the COVID-19 declaration under Section 564(b)(1) of the Act, 21 U.S.C. section 360bbb-3(b)(1), unless the authorization is terminated or revoked.  Performed at [facility], [address] [place], [place] [address]      Total time spend on discharging this patient, including the last patient exam, discussing the hospital stay, instructions for  ongoing care as it relates to all pertinent caregivers, as well as preparing the medical discharge records, prescriptions, and/or referrals as applicable, is 50 minutes.    Enzo Bi, MD  [facility] 12/02/2022, 9:51 AM

## 2022-12-03 LAB — CULTURE, BLOOD (ROUTINE X 2)
Culture: NO GROWTH
Culture: NO GROWTH
Special Requests: ADEQUATE
Special Requests: ADEQUATE

## 2022-12-07 ENCOUNTER — Other Ambulatory Visit: Payer: Self-pay

## 2022-12-16 ENCOUNTER — Other Ambulatory Visit: Payer: Self-pay

## 2022-12-16 NOTE — Telephone Encounter (Signed)
Clearance faxed x 2 

## 2022-12-24 ENCOUNTER — Other Ambulatory Visit: Payer: Self-pay

## 2022-12-28 ENCOUNTER — Other Ambulatory Visit: Payer: Self-pay

## 2023-01-10 ENCOUNTER — Other Ambulatory Visit: Payer: Self-pay

## 2023-01-12 ENCOUNTER — Other Ambulatory Visit: Payer: Self-pay | Admitting: General Surgery

## 2023-01-12 DIAGNOSIS — K828 Other specified diseases of gallbladder: Secondary | ICD-10-CM

## 2023-01-12 DIAGNOSIS — K7460 Unspecified cirrhosis of liver: Secondary | ICD-10-CM

## 2023-01-17 ENCOUNTER — Other Ambulatory Visit: Payer: Self-pay

## 2023-01-25 ENCOUNTER — Ambulatory Visit
Admission: RE | Admit: 2023-01-25 | Discharge: 2023-01-25 | Disposition: A | Discharge status: Home / Self Care | Payer: Medicare Other | Source: Ambulatory Visit | Attending: General Surgery | Admitting: General Surgery

## 2023-01-25 DIAGNOSIS — K828 Other specified diseases of gallbladder: Secondary | ICD-10-CM

## 2023-01-25 DIAGNOSIS — K7460 Unspecified cirrhosis of liver: Secondary | ICD-10-CM

## 2023-01-27 ENCOUNTER — Telehealth: Payer: Self-pay

## 2023-01-27 NOTE — Telephone Encounter (Signed)
Left message on voicemail--pt  Spoke to son and he stated Dr Juliann Pares told pt to stop 2 days prior and restart 3 days after. He will bring form he has tomorrow

## 2023-01-27 NOTE — Telephone Encounter (Signed)
Sent to cardiology Dr Juliann Pares, I also called the office to see if a note can be sent to the nurse regarding the clearance

## 2023-01-28 NOTE — Telephone Encounter (Signed)
Clearance received and sent to be scanned

## 2023-01-31 ENCOUNTER — Encounter: Payer: Self-pay | Admitting: *Deleted

## 2023-02-01 ENCOUNTER — Other Ambulatory Visit: Payer: Self-pay | Admitting: General Surgery

## 2023-02-01 ENCOUNTER — Ambulatory Visit
Admission: RE | Admit: 2023-02-01 | Discharge: 2023-02-01 | Disposition: A | Discharge status: Home / Self Care | Payer: Medicare Other | Attending: Gastroenterology | Admitting: Gastroenterology

## 2023-02-01 ENCOUNTER — Encounter: Payer: Self-pay | Admitting: Gastroenterology

## 2023-02-01 ENCOUNTER — Other Ambulatory Visit: Payer: Self-pay

## 2023-02-01 ENCOUNTER — Ambulatory Visit: Payer: Medicare Other | Admitting: Certified Registered"

## 2023-02-01 ENCOUNTER — Encounter
Admission: RE | Disposition: A | Discharge status: Home / Self Care | Payer: Self-pay | Source: Home / Self Care | Attending: Gastroenterology

## 2023-02-01 ENCOUNTER — Ambulatory Visit: Payer: Medicare Other

## 2023-02-01 DIAGNOSIS — K8050 Calculus of bile duct without cholangitis or cholecystitis without obstruction: Secondary | ICD-10-CM | POA: Diagnosis not present

## 2023-02-01 DIAGNOSIS — G2581 Restless legs syndrome: Secondary | ICD-10-CM | POA: Insufficient documentation

## 2023-02-01 DIAGNOSIS — Z87891 Personal history of nicotine dependence: Secondary | ICD-10-CM | POA: Insufficient documentation

## 2023-02-01 DIAGNOSIS — I10 Essential (primary) hypertension: Secondary | ICD-10-CM | POA: Insufficient documentation

## 2023-02-01 DIAGNOSIS — I671 Cerebral aneurysm, nonruptured: Secondary | ICD-10-CM | POA: Diagnosis not present

## 2023-02-01 DIAGNOSIS — K7460 Unspecified cirrhosis of liver: Secondary | ICD-10-CM

## 2023-02-01 DIAGNOSIS — I482 Chronic atrial fibrillation, unspecified: Secondary | ICD-10-CM | POA: Insufficient documentation

## 2023-02-01 DIAGNOSIS — Z87440 Personal history of urinary (tract) infections: Secondary | ICD-10-CM | POA: Diagnosis not present

## 2023-02-01 DIAGNOSIS — Z4689 Encounter for fitting and adjustment of other specified devices: Secondary | ICD-10-CM

## 2023-02-01 DIAGNOSIS — K828 Other specified diseases of gallbladder: Secondary | ICD-10-CM

## 2023-02-01 HISTORY — PX: ERCP: SHX5425

## 2023-02-01 SURGERY — ERCP, WITH INTERVENTION IF INDICATED
Anesthesia: General

## 2023-02-01 MED ORDER — PHENYLEPHRINE HCL (PRESSORS) 10 MG/ML IV SOLN
INTRAVENOUS | Status: DC | PRN
  Administered 2023-02-01: 80 ug via INTRAVENOUS

## 2023-02-01 MED ORDER — SODIUM CHLORIDE 0.9 % IV SOLN: INTRAVENOUS | Status: DC

## 2023-02-01 MED ORDER — LACTATED RINGERS IV SOLN: INTRAVENOUS | Status: DC

## 2023-02-01 MED ORDER — PHENYLEPHRINE 80 MCG/ML (10ML) SYRINGE FOR IV PUSH (FOR BLOOD PRESSURE SUPPORT)
PREFILLED_SYRINGE | INTRAVENOUS | Status: AC
  Filled 2023-02-01: qty 10

## 2023-02-01 MED ORDER — DICLOFENAC SUPPOSITORY 100 MG
RECTAL | Status: AC
  Filled 2023-02-01: qty 1

## 2023-02-01 MED ORDER — PROPOFOL 10 MG/ML IV BOLUS
INTRAVENOUS | Status: AC
  Filled 2023-02-01: qty 20

## 2023-02-01 MED ORDER — LIDOCAINE HCL (CARDIAC) PF 100 MG/5ML IV SOSY
PREFILLED_SYRINGE | INTRAVENOUS | Status: DC | PRN
  Administered 2023-02-01: 100 mg via INTRAVENOUS

## 2023-02-01 MED ORDER — LABETALOL HCL 5 MG/ML IV SOLN
INTRAVENOUS | Status: DC | PRN
  Administered 2023-02-01: 5 mg via INTRAVENOUS

## 2023-02-01 MED ORDER — DICLOFENAC SUPPOSITORY 100 MG
100.0000 mg | Freq: Once | RECTAL | Status: AC
  Administered 2023-02-01: 100 mg via RECTAL

## 2023-02-01 MED ORDER — METOPROLOL TARTRATE 5 MG/5ML IV SOLN
INTRAVENOUS | Status: DC | PRN
  Administered 2023-02-01: 3 mg via INTRAVENOUS
  Administered 2023-02-01: 2 mg via INTRAVENOUS

## 2023-02-01 MED ORDER — LABETALOL HCL 5 MG/ML IV SOLN
INTRAVENOUS | Status: AC
  Filled 2023-02-01: qty 4

## 2023-02-01 MED ORDER — PROPOFOL 10 MG/ML IV BOLUS
INTRAVENOUS | Status: DC | PRN
  Administered 2023-02-01: 100 mg via INTRAVENOUS
  Administered 2023-02-01: 170 ug/kg/min via INTRAVENOUS

## 2023-02-01 NOTE — Transfer of Care (Signed)
Immediate Anesthesia Transfer of Care Note  Patient: Ana Herrera  Procedure(s) Performed: ENDOSCOPIC RETROGRADE CHOLANGIOPANCREATOGRAPHY (ERCP)  Patient Location: Endoscopy Unit  Anesthesia Type:General  Level of Consciousness: drowsy  Airway & Oxygen Therapy: Patient Spontanous Breathing  Post-op Assessment: Report given to RN and Post -op Vital signs reviewed and stable  Post vital signs: Reviewed and stable  Last Vitals:  Vitals Value Taken Time  BP 93/66 02/01/23 1151  Temp 35.9 1151  Pulse 93 02/01/23 1151  Resp 20 02/01/23 1151  SpO2 99 % 02/01/23 1151    Last Pain:  Vitals:   02/01/23 1015  TempSrc: Temporal  PainSc: 0-No pain         Complications: No notable events documented.

## 2023-02-01 NOTE — Op Note (Addendum)
[facility] Gastroenterology Patient Name: Ana Herrera Procedure Date: 02/01/2023 11:11 AM MRN: [id number] Account #: [id number] Date of Birth: 1956/05/24 Admit Type: Outpatient Age: 67 Room: [facility] ENDO ROOM 4 Gender: Female Note Status: Finalized Instrument Name: EXALT Ercp scope Procedure:             ERCP Indications:           Common bile duct stone(s), Stent removal Providers:             Midge Minium MD, MD Medicines:             Propofol per Anesthesia Complications:         No immediate complications. Procedure:             Pre-Anesthesia Assessment:                        - Prior to the procedure, a History and Physical was                         performed, and patient medications and allergies were                         reviewed. The patient's tolerance of previous                         anesthesia was also reviewed. The risks and benefits                         of the procedure and the sedation options and risks                         were discussed with the patient. All questions were                         answered, and informed consent was obtained. Prior                         Anticoagulants: The patient has taken Eliquis                         (apixaban), last dose was 3 days prior to procedure.                         ASA Grade Assessment: II - A patient with mild                         systemic disease. After reviewing the risks and                         benefits, the patient was deemed in satisfactory                         condition to undergo the procedure.                        After obtaining informed consent, the scope was passed                         under direct  vision. Throughout the procedure, the                         patient's blood pressure, pulse, and oxygen                         saturations were monitored continuously. The [employer] D single use duodenoscope was                          introduced through the mouth, and used to inject                         contrast into and used to inject contrast into the                         bile duct. The ERCP was accomplished without                         difficulty. The patient tolerated the procedure well. Findings:      A scout film of the abdomen was obtained. One stent ending in the main       bile duct was seen. One stent was removed from the common bile duct       using a snare. The bile duct was deeply cannulated with the short-nosed       traction sphincterotome. Contrast was injected. I personally interpreted       the bile duct images. There was brisk flow of contrast through the       ducts. Image quality was excellent. Contrast extended to the entire       biliary tree. The lower third of the main bile duct contained multiple       stones. A wire was passed into the biliary tree. A 7 mm biliary       sphincterotomy was made with a traction (standard) sphincterotome using       ERBE electrocautery. There was no post-sphincterotomy bleeding. The       biliary tree was swept with a 15 mm balloon starting at the bifurcation.       Many stones were removed. No stones remained. The major papilla was       bulging. Ampulla were biopsied with a cold forceps [Purpose]. Impression:            - Choledocholithiasis was found. Complete removal was                         accomplished by biliary sphincterotomy and balloon                         extraction.                        - One stent was removed from the common bile duct.                        - A biliary sphincterotomy was performed.                        -  The biliary tree was swept. Recommendation:        - Discharge patient to home.                        - Clear liquid diet today. Procedure Code(s):     --- Professional ---                        [phone], Endoscopic retrograde cholangiopancreatography                         (ERCP); with  removal of foreign body(s) or stent(s)                         from biliary/pancreatic duct(s)                        43264, Endoscopic retrograde cholangiopancreatography                         (ERCP); with removal of calculi/debris from                         biliary/pancreatic duct(s)                        43262, Endoscopic retrograde cholangiopancreatography                         (ERCP); with sphincterotomy/papillotomy                        43261, Endoscopic retrograde cholangiopancreatography                         (ERCP); with biopsy, single or multiple                        74328, Endoscopic catheterization of the biliary                         ductal system, radiological supervision and                         interpretation Diagnosis Code(s):     --- Professional ---                        K80.50, Calculus of bile duct without cholangitis or                         cholecystitis without obstruction                        Z46.59, Encounter for fitting and adjustment of other                         gastrointestinal appliance and device CPT copyright 2022 American [employer] Association. All rights reserved. The codes documented in this report are preliminary and upon coder review may  be revised to meet current compliance requirements. Midge Minium MD, MD 02/01/2023 11:48:47 AM This report has been signed electronically. Number of Addenda: 0 Note Initiated On: 02/01/2023 11:11 AM Estimated Blood  Loss:  Estimated blood loss: none.      [facility]

## 2023-02-01 NOTE — Anesthesia Postprocedure Evaluation (Signed)
Anesthesia Post Note  Patient: Ana Herrera  Procedure(s) Performed: ENDOSCOPIC RETROGRADE CHOLANGIOPANCREATOGRAPHY (ERCP)  Patient location during evaluation: Endoscopy Anesthesia Type: General Level of consciousness: awake and alert Pain management: pain level controlled Vital Signs Assessment: post-procedure vital signs reviewed and stable Respiratory status: spontaneous breathing, nonlabored ventilation, respiratory function stable and patient connected to nasal cannula oxygen Cardiovascular status: blood pressure returned to baseline and stable Postop Assessment: no apparent nausea or vomiting Anesthetic complications: no   No notable events documented.   Last Vitals:  Vitals:   02/01/23 1210 02/01/23 1224  BP: (!) 115/93 (!) 152/108  Pulse: 94 80  Resp: 17 16  Temp:  (!) 35.7 C  SpO2: 100% 100%    Last Pain:  Vitals:   02/01/23 1224  TempSrc: Temporal  PainSc:                  Corinda Gubler

## 2023-02-01 NOTE — Anesthesia Preprocedure Evaluation (Signed)
Anesthesia Evaluation  Patient identified by MRN, date of birth, ID band Patient awake    Reviewed: Allergy & Precautions, NPO status , Patient's Chart, lab work & pertinent test results  History of Anesthesia Complications Negative for: history of anesthetic complications  Airway Mallampati: IV  TM Distance: >3 FB Neck ROM: Full    Dental no notable dental hx. (+) Teeth Intact   Pulmonary neg pulmonary ROS, neg sleep apnea, neg COPD, Patient abstained from smoking.Not current smoker, former smoker   Pulmonary exam normal breath sounds clear to auscultation       Cardiovascular Exercise Tolerance: Good METShypertension, Pt. on medications (-) CAD and (-) Past MI + dysrhythmias Atrial Fibrillation  Rhythm:Regular Rate:Normal - Systolic murmurs    Neuro/Psych CVA, No Residual Symptoms  negative psych ROS   GI/Hepatic ,neg GERD  ,,(+)     (-) substance abuse    Endo/Other  neg diabetes    Renal/GU negative Renal ROS     Musculoskeletal   Abdominal   Peds  Hematology   Anesthesia Other Findings Past Medical History: No date: Alcohol abuse, in remission No date: Cerebral aneurysm No date: Chronic atrial fibrillation (HCC) No date: Gout No date: HTN (hypertension) No date: RLS (restless legs syndrome) No date: SAH (subarachnoid hemorrhage) (HCC) No date: Stroke (HCC) No date: Tobacco abuse No date: UTI (urinary tract infection)  Reproductive/Obstetrics                              Anesthesia Physical Anesthesia Plan  ASA: 3  Anesthesia Plan: General   Post-op Pain Management: Minimal or no pain anticipated   Induction: Intravenous  PONV Risk Score and Plan: 3 and Propofol infusion, TIVA and Ondansetron  Airway Management Planned: Nasal Cannula  Additional Equipment: None  Intra-op Plan:   Post-operative Plan:   Informed Consent: I have reviewed the patients History and  Physical, chart, labs and discussed the procedure including the risks, benefits and alternatives for the proposed anesthesia with the patient or authorized representative who has indicated his/her understanding and acceptance.     Dental advisory given  Plan Discussed with: CRNA and Surgeon  Anesthesia Plan Comments: (Discussed risks of anesthesia with patient, including possibility of difficulty with spontaneous ventilation under anesthesia necessitating airway intervention, PONV, and rare risks such as cardiac or respiratory or neurological events, and allergic reactions. Discussed the role of CRNA in patient's perioperative care. Patient understands.)         Anesthesia Quick Evaluation

## 2023-02-01 NOTE — H&P (Signed)
Midge Minium, MD [facility] [address] [place], [place] [address] Phone:[phone] Fax : [phone]  Primary Care Physician:  Dione Housekeeper, MD Primary Gastroenterologist:  Dr. Servando Snare  Pre-Procedure History & Physical: HPI:  Ana Herrera is a 67 y.o. female is here for an ERCP.   Past Medical History:  Diagnosis Date   Alcohol abuse, in remission    Cerebral aneurysm    Chronic atrial fibrillation (HCC)    Gout    HTN (hypertension)    RLS (restless legs syndrome)    SAH (subarachnoid hemorrhage) (HCC)    Stroke (HCC)    Tobacco abuse    UTI (urinary tract infection)     Past Surgical History:  Procedure Laterality Date   CEREBRAL ANEURYSM REPAIR     with clip per her son   ERCP N/A 11/29/2022   Procedure: ENDOSCOPIC RETROGRADE CHOLANGIOPANCREATOGRAPHY (ERCP);  Surgeon: Midge Minium, MD;  Location: [facility] ENDOSCOPY;  Service: Endoscopy;  Laterality: N/A;    Prior to Admission medications   Medication Sig Start Date End Date Taking? Authorizing Provider  apixaban (ELIQUIS) 5 MG TABS tablet Take 1 tablet (5 mg total) by mouth 2 (two) times daily. 12/03/22 03/03/23 Yes Darlin Priestly, MD  diltiazem (CARDIZEM CD) 240 MG 24 hr capsule Take by mouth. 07/21/22 07/21/23 Yes [provider]  lisinopril (ZESTRIL) 20 MG tablet Hold until followup with primary care doctor due to low blood pressure and acute kidney injury. 12/02/22  Yes Darlin Priestly, MD  nicotine (NICODERM CQ - DOSED IN MG/24 HOURS) 21 mg/24hr patch Place 1 patch (21 mg total) onto the skin daily. 12/03/22  Yes Darlin Priestly, MD    Allergies as of 12/02/2022   (No Known Allergies)    Family History  Problem Relation Age of Onset   Hypertension Father    Hypertension Brother     Social History   Socioeconomic History   Marital status: Widowed    Spouse name: Not on file   Number of children: Not on file   Years of education: Not on file   Highest education level: Not on file  Occupational  History   Not on file  Tobacco Use   Smoking status: Former    Types: Cigarettes    Quit date: 12/2022    Years since quitting: 0.1   Smokeless tobacco: Never  Vaping Use   Vaping Use: Never used  Substance and Sexual Activity   Alcohol use: Not Currently   Drug use: Never   Sexual activity: Not on file  Other Topics Concern   Not on file  Social History Narrative   Not on file   Social Determinants of Health   Financial Resource Strain: Not on file  Food Insecurity: No Food Insecurity (11/29/2022)   Hunger Vital Sign    Worried About Running Out of Food in the Last Year: Never true    Ran Out of Food in the Last Year: Never true  Transportation Needs: No Transportation Needs (11/29/2022)   PRAPARE - Administrator, Civil Service (Medical): No    Lack of Transportation (Non-Medical): No  Physical Activity: Not on file  Stress: Not on file  Social Connections: Not on file  Intimate Partner Violence: Not At Risk (11/29/2022)   Humiliation, Afraid, Rape, and Kick questionnaire    Fear of Current or Ex-Partner: No    Emotionally Abused: No    Physically Abused: No    Sexually Abused: No  Review of Systems: See HPI, otherwise negative ROS  Physical Exam: BP (!) 152/119   Pulse 95   Temp (!) 97.3 F (36.3 C) (Temporal)   Resp 18   Ht 5\' 7"  (1.702 m)   Wt 56.2 kg   SpO2 100%   BMI 19.42 kg/m  General:   Alert,  pleasant and cooperative in NAD Head:  Normocephalic and atraumatic. Neck:  Supple; no masses or thyromegaly. Lungs:  Clear throughout to auscultation.    Heart:  Regular rate and rhythm. Abdomen:  Soft, nontender and nondistended. Normal bowel sounds, without guarding, and without rebound.   Neurologic:  Alert and  oriented x4;  grossly normal neurologically.  Impression/Plan: Ana Herrera is here for an ERCP to be performed for stent removal and stone extraction  Risks, benefits, limitations, and alternatives regarding  ERCP have  been reviewed with the patient.  Questions have been answered.  All parties agreeable.   Midge Minium, MD  02/01/2023, 10:53 AM

## 2023-02-02 ENCOUNTER — Encounter: Payer: Self-pay | Admitting: Gastroenterology

## 2023-02-02 LAB — SURGICAL PATHOLOGY

## 2023-05-19 ENCOUNTER — Inpatient Hospital Stay
Admission: EM | Admit: 2023-05-19 | Discharge: 2023-05-23 | DRG: 884 | Disposition: A | Discharge status: Home / Self Care | Payer: Medicare Other | Attending: Internal Medicine | Admitting: Internal Medicine

## 2023-05-19 ENCOUNTER — Emergency Department: Payer: Medicare Other

## 2023-05-19 ENCOUNTER — Other Ambulatory Visit: Payer: Self-pay

## 2023-05-19 DIAGNOSIS — R000 Tachycardia, unspecified: Secondary | ICD-10-CM | POA: Diagnosis not present

## 2023-05-19 DIAGNOSIS — Z681 Body mass index (BMI) 19 or less, adult: Secondary | ICD-10-CM

## 2023-05-19 DIAGNOSIS — M109 Gout, unspecified: Secondary | ICD-10-CM | POA: Diagnosis present

## 2023-05-19 DIAGNOSIS — E876 Hypokalemia: Secondary | ICD-10-CM | POA: Diagnosis present

## 2023-05-19 DIAGNOSIS — Z8673 Personal history of transient ischemic attack (TIA), and cerebral infarction without residual deficits: Secondary | ICD-10-CM

## 2023-05-19 DIAGNOSIS — Z79899 Other long term (current) drug therapy: Secondary | ICD-10-CM

## 2023-05-19 DIAGNOSIS — R627 Adult failure to thrive: Secondary | ICD-10-CM | POA: Diagnosis present

## 2023-05-19 DIAGNOSIS — R410 Disorientation, unspecified: Secondary | ICD-10-CM | POA: Diagnosis not present

## 2023-05-19 DIAGNOSIS — Z720 Tobacco use: Secondary | ICD-10-CM | POA: Diagnosis present

## 2023-05-19 DIAGNOSIS — K8050 Calculus of bile duct without cholangitis or cholecystitis without obstruction: Secondary | ICD-10-CM | POA: Diagnosis present

## 2023-05-19 DIAGNOSIS — Z1152 Encounter for screening for COVID-19: Secondary | ICD-10-CM

## 2023-05-19 DIAGNOSIS — I482 Chronic atrial fibrillation, unspecified: Secondary | ICD-10-CM | POA: Diagnosis present

## 2023-05-19 DIAGNOSIS — Z8249 Family history of ischemic heart disease and other diseases of the circulatory system: Secondary | ICD-10-CM

## 2023-05-19 DIAGNOSIS — F0390 Unspecified dementia without behavioral disturbance: Secondary | ICD-10-CM | POA: Diagnosis not present

## 2023-05-19 DIAGNOSIS — F1011 Alcohol abuse, in remission: Secondary | ICD-10-CM | POA: Diagnosis present

## 2023-05-19 DIAGNOSIS — R4182 Altered mental status, unspecified: Principal | ICD-10-CM | POA: Diagnosis present

## 2023-05-19 DIAGNOSIS — G2581 Restless legs syndrome: Secondary | ICD-10-CM | POA: Diagnosis present

## 2023-05-19 DIAGNOSIS — K8309 Other cholangitis: Secondary | ICD-10-CM | POA: Diagnosis present

## 2023-05-19 DIAGNOSIS — E440 Moderate protein-calorie malnutrition: Secondary | ICD-10-CM | POA: Insufficient documentation

## 2023-05-19 DIAGNOSIS — N179 Acute kidney failure, unspecified: Secondary | ICD-10-CM | POA: Diagnosis present

## 2023-05-19 DIAGNOSIS — Z87891 Personal history of nicotine dependence: Secondary | ICD-10-CM

## 2023-05-19 DIAGNOSIS — I639 Cerebral infarction, unspecified: Secondary | ICD-10-CM | POA: Diagnosis present

## 2023-05-19 DIAGNOSIS — I10 Essential (primary) hypertension: Secondary | ICD-10-CM | POA: Diagnosis present

## 2023-05-19 DIAGNOSIS — Z7901 Long term (current) use of anticoagulants: Secondary | ICD-10-CM

## 2023-05-19 LAB — URINALYSIS, W/ REFLEX TO CULTURE (INFECTION SUSPECTED)
Bilirubin Urine: NEGATIVE
Glucose, UA: NEGATIVE mg/dL
Hgb urine dipstick: NEGATIVE
Ketones, ur: NEGATIVE mg/dL
Leukocytes,Ua: NEGATIVE
Nitrite: NEGATIVE
Protein, ur: NEGATIVE mg/dL
Specific Gravity, Urine: 1.008 (ref 1.005–1.030)
pH: 6 (ref 5.0–8.0)

## 2023-05-19 LAB — COMPREHENSIVE METABOLIC PANEL
ALT: 10 U/L (ref 0–44)
AST: 13 U/L — ABNORMAL LOW (ref 15–41)
Albumin: 3.8 g/dL (ref 3.5–5.0)
Alkaline Phosphatase: 96 U/L (ref 38–126)
Anion gap: 9 (ref 5–15)
BUN: 8 mg/dL (ref 8–23)
CO2: 24 mmol/L (ref 22–32)
Calcium: 9.5 mg/dL (ref 8.9–10.3)
Chloride: 104 mmol/L (ref 98–111)
Creatinine, Ser: 0.83 mg/dL (ref 0.44–1.00)
GFR, Estimated: >60 mL/min (ref >60)
Glucose, Bld: 114 mg/dL — ABNORMAL HIGH (ref 70–99)
Potassium: 3.6 mmol/L (ref 3.5–5.1)
Sodium: 137 mmol/L (ref 135–145)
Total Bilirubin: 0.9 mg/dL (ref 0.3–1.2)
Total Protein: 7.2 g/dL (ref 6.5–8.1)

## 2023-05-19 LAB — T4, FREE: Free T4: 0.77 ng/dL (ref 0.61–1.12)

## 2023-05-19 LAB — AMMONIA: Ammonia: <10 umol/L (ref 9–35)

## 2023-05-19 LAB — CBC
HCT: 45 % (ref 36.0–46.0)
Hemoglobin: 14.4 g/dL (ref 12.0–15.0)
MCH: 29.6 pg (ref 26.0–34.0)
MCHC: 32 g/dL (ref 30.0–36.0)
MCV: 92.6 fL (ref 80.0–100.0)
Platelets: 335 10*3/uL (ref 150–400)
RBC: 4.86 MIL/uL (ref 3.87–5.11)
RDW: 14.3 % (ref 11.5–15.5)
WBC: 8.3 10*3/uL (ref 4.0–10.5)
nRBC: 0 % (ref 0.0–0.2)

## 2023-05-19 LAB — SEDIMENTATION RATE: Sed Rate: 22 mm/hr (ref 0–30)

## 2023-05-19 LAB — D-DIMER, QUANTITATIVE: D-Dimer, Quant: <0.27 ug{FEU}/mL (ref 0.00–0.50)

## 2023-05-19 LAB — TROPONIN I (HIGH SENSITIVITY)
Troponin I (High Sensitivity): 13 ng/L (ref <18)
Troponin I (High Sensitivity): 14 ng/L (ref <18)

## 2023-05-19 LAB — LACTIC ACID, PLASMA
Lactic Acid, Venous: 1.3 mmol/L (ref 0.5–1.9)
Lactic Acid, Venous: 1.7 mmol/L (ref 0.5–1.9)

## 2023-05-19 LAB — TSH: TSH: 4.13 u[IU]/mL (ref 0.350–4.500)

## 2023-05-19 LAB — SARS CORONAVIRUS 2 BY RT PCR: SARS Coronavirus 2 by RT PCR: NEGATIVE

## 2023-05-19 MED ORDER — ACETAMINOPHEN 325 MG PO TABS: 650.0000 mg | ORAL_TABLET | ORAL | Status: DC | PRN

## 2023-05-19 MED ORDER — THIAMINE HCL 100 MG/ML IJ SOLN
100.0000 mg | Freq: Once | INTRAMUSCULAR | Status: AC
  Administered 2023-05-19: 100 mg via INTRAVENOUS
  Filled 2023-05-19: qty 2

## 2023-05-19 MED ORDER — ACETAMINOPHEN 160 MG/5ML PO SOLN: 650.0000 mg | ORAL | Status: DC | PRN

## 2023-05-19 MED ORDER — LABETALOL HCL 5 MG/ML IV SOLN
10.0000 mg | Freq: Once | INTRAVENOUS | Status: AC
  Administered 2023-05-19: 10 mg via INTRAVENOUS
  Filled 2023-05-19: qty 4

## 2023-05-19 MED ORDER — STROKE: EARLY STAGES OF RECOVERY BOOK: Freq: Once | Status: DC

## 2023-05-19 MED ORDER — APIXABAN 5 MG PO TABS
5.0000 mg | ORAL_TABLET | Freq: Two times a day (BID) | ORAL | Status: DC
  Administered 2023-05-19 – 2023-05-23 (×8): 5 mg via ORAL
  Filled 2023-05-19 (×8): qty 1

## 2023-05-19 MED ORDER — PANTOPRAZOLE SODIUM 40 MG IV SOLR
40.0000 mg | Freq: Two times a day (BID) | INTRAVENOUS | Status: DC
  Administered 2023-05-19 – 2023-05-21 (×4): 40 mg via INTRAVENOUS
  Filled 2023-05-19 (×4): qty 10

## 2023-05-19 MED ORDER — SODIUM CHLORIDE 0.9 % IV SOLN: INTRAVENOUS | Status: DC

## 2023-05-19 MED ORDER — LACTATED RINGERS IV BOLUS
1000.0000 mL | Freq: Once | INTRAVENOUS | Status: AC
  Administered 2023-05-19: 1000 mL via INTRAVENOUS

## 2023-05-19 MED ORDER — ACETAMINOPHEN 650 MG RE SUPP: 650.0000 mg | RECTAL | Status: DC | PRN

## 2023-05-19 MED ORDER — HYDRALAZINE HCL 20 MG/ML IJ SOLN
5.0000 mg | INTRAMUSCULAR | Status: DC | PRN
  Administered 2023-05-20 – 2023-05-21 (×3): 5 mg via INTRAVENOUS
  Filled 2023-05-19 (×3): qty 1

## 2023-05-19 NOTE — ED Notes (Signed)
Pt continues to be unable to provide adequate urine sample. Given ice water.

## 2023-05-19 NOTE — ED Notes (Signed)
See triage note, pt with family reports increased confusion over the past few days. Denies urinary sx. Pt unable to provide sample at this time. Denies recent falls

## 2023-05-19 NOTE — H&P (Addendum)
History and Physical    Patient: Ana Herrera Ana Herrera:[id number] DOB: Feb 04, 1956 DOA: 05/19/2023 DOS: the patient was seen and examined on 05/20/2023 PCP: Dione Housekeeper, MD  Patient coming from: Home   Chief Complaint:  Chief Complaint  Patient presents with   Altered Mental Status    HPI: Ana Herrera is a 67 y.o. female with medical history significant for hypertension, chronic A-fib, cerebral aneurysm status post history of clip, restless leg syndrome, tobacco abuse brought by family paranoia confusion and delirious behavior for the past few days patient had a similar episode when she had gallbladder issues that was said to be because of the ammonia patient has been not eating cooperating with family members.  Patient is awake and alert to herself and where she is on arrival per nurses report.  Patient's symptoms of confusion have been progressively worsening over the past few months as well.  Patient did not recognize her family members today family members son and granddaughter at bedside states that her intermittent confusion has been going on for about 6 months or she is saying strange things and confabulating, significant weight loss of 150 pounds over the past few years.  Clinical presentation of failure to thrive and dementia plus worsening. In the emergency room initial EKG A-fib with no ST-T wave changes.  Head CT negative for any intracranial hemorrhage or infarct.  Lab work shows glucose 114 normal kidney function normal electrolytes normal ammonia level normal CBC.  Review of Systems: Review of Systems  Unable to perform ROS: Dementia    Past Medical History:  Diagnosis Date   Alcohol abuse, in remission    Cerebral aneurysm    Chronic atrial fibrillation (HCC)    Gout    HTN (hypertension)    RLS (restless legs syndrome)    SAH (subarachnoid hemorrhage) (HCC)    Stroke (HCC)    Tobacco abuse    UTI (urinary tract infection)    Past Surgical History:   Procedure Laterality Date   CEREBRAL ANEURYSM REPAIR     with clip per her son   ERCP N/A 11/29/2022   Procedure: ENDOSCOPIC RETROGRADE CHOLANGIOPANCREATOGRAPHY (ERCP);  Surgeon: Midge Minium, MD;  Location: [facility] ENDOSCOPY;  Service: Endoscopy;  Laterality: N/A;   ERCP N/A 02/01/2023   Procedure: ENDOSCOPIC RETROGRADE CHOLANGIOPANCREATOGRAPHY (ERCP);  Surgeon: Midge Minium, MD;  Location: [facility] ENDOSCOPY;  Service: Endoscopy;  Laterality: N/A;  STENT REMOVAL   Social History:   reports that she quit smoking about 5 months ago. Her smoking use included cigarettes. She has never used smokeless tobacco. She reports that she does not currently use alcohol. She reports that she does not use drugs.  No Known Allergies  Family History  Problem Relation Age of Onset   Hypertension Father    Hypertension Brother     Prior to Admission medications   Medication Sig Start Date End Date Taking? Authorizing Provider  apixaban (ELIQUIS) 5 MG TABS tablet Take 1 tablet (5 mg total) by mouth 2 (two) times daily. 12/03/22 03/03/23  Darlin Priestly, MD  diltiazem (CARDIZEM CD) 240 MG 24 hr capsule Take by mouth. 07/21/22 07/21/23  [provider]  lisinopril (ZESTRIL) 20 MG tablet Hold until followup with primary care doctor due to low blood pressure and acute kidney injury. 12/02/22   Darlin Priestly, MD  nicotine (NICODERM CQ - DOSED IN MG/24 HOURS) 21 mg/24hr patch Place 1 patch (21 mg total) onto the skin daily. 12/03/22   Darlin Priestly, MD  Vitals:   05/19/23 2310 05/19/23 2336 05/20/23 0004 05/20/23 0042  BP: (!) 159/108  (!) 106/91 (!) 178/96  Pulse: 88  80 80  Resp:   15 17  Temp:  97.9 F (36.6 C)  (!) 97.5 F (36.4 C)  TempSrc:  Oral    SpO2: 97%  95% 98%   Physical Exam Vitals and nursing note reviewed.  Constitutional:      General: She is not in acute distress. HENT:     Head: Normocephalic and atraumatic.     Right Ear: Hearing normal.     Left Ear: Hearing normal.     Nose: Nose  normal. No nasal deformity.     Mouth/Throat:     Lips: Pink.     Tongue: No lesions.     Pharynx: Oropharynx is clear.  Eyes:     General: Lids are normal.     Extraocular Movements: Extraocular movements intact.  Cardiovascular:     Rate and Rhythm: Normal rate and regular rhythm.     Heart sounds: Normal heart sounds.  Pulmonary:     Effort: Pulmonary effort is normal.     Breath sounds: Normal breath sounds.  Abdominal:     General: Bowel sounds are normal. There is no distension.     Palpations: Abdomen is soft. There is no mass.     Tenderness: There is no abdominal tenderness.  Musculoskeletal:     Right lower leg: No edema.     Left lower leg: No edema.  Skin:    General: Skin is warm.  Neurological:     General: No focal deficit present.     Mental Status: She is alert.     Cranial Nerves: Cranial nerves 2-12 are intact.     Sensory: No sensory deficit.     Motor: No weakness.  Psychiatric:        Attention and Perception: Attention normal.        Mood and Affect: Mood normal.        Speech: Speech normal.        Behavior: Behavior normal. Behavior is cooperative.      Labs on Admission: I have personally reviewed following labs and imaging studies  CBC: Recent Labs  Lab 05/19/23 1300  WBC 8.3  HGB 14.4  HCT 45.0  MCV 92.6  PLT 335   Basic Metabolic Panel: Recent Labs  Lab 05/19/23 1300  NA 137  K 3.6  CL 104  CO2 24  GLUCOSE 114*  BUN 8  CREATININE 0.83  CALCIUM 9.5   GFR: CrCl cannot be calculated (Unknown ideal weight.). Liver Function Tests: Recent Labs  Lab 05/19/23 1300  AST 13*  ALT 10  ALKPHOS 96  BILITOT 0.9  PROT 7.2  ALBUMIN 3.8   No results for input(s): "LIPASE", "AMYLASE" in the last 168 hours. Recent Labs  Lab 05/19/23 1614  AMMONIA <10   Coagulation Profile: No results for input(s): "INR", "PROTIME" in the last 168 hours. Cardiac Enzymes: No results for input(s): "CKTOTAL", "CKMB", "CKMBINDEX", "TROPONINI" in  the last 168 hours. BNP (last 3 results) No results for input(s): "PROBNP" in the last 8760 hours. HbA1C: No results for input(s): "HGBA1C" in the last 72 hours. CBG: No results for input(s): "GLUCAP" in the last 168 hours. Lipid Profile: No results for input(s): "CHOL", "HDL", "LDLCALC", "TRIG", "CHOLHDL", "LDLDIRECT" in the last 72 hours. Thyroid Function Tests: Recent Labs    05/19/23 2015  TSH 4.130  FREET4 0.77  Anemia Panel: Recent Labs    05/19/23 2025  VITAMINB12 112*   Urinalysis    Component Value Date/Time   COLORURINE YELLOW (A) 05/19/2023 1614   APPEARANCEUR HAZY (A) 05/19/2023 1614   LABSPEC 1.008 05/19/2023 1614   PHURINE 6.0 05/19/2023 1614   GLUCOSEU NEGATIVE 05/19/2023 1614   HGBUR NEGATIVE 05/19/2023 1614   BILIRUBINUR NEGATIVE 05/19/2023 1614   KETONESUR NEGATIVE 05/19/2023 1614   PROTEINUR NEGATIVE 05/19/2023 1614   NITRITE NEGATIVE 05/19/2023 1614   LEUKOCYTESUR NEGATIVE 05/19/2023 1614      Unresulted Labs (From admission, onward)     Start     Ordered   05/19/23 2044  Vitamin B12  Add-on,   AD        05/19/23 2044   05/19/23 2044  T4, free  Add-on,   AD        05/19/23 2044   05/19/23 2044  TSH  Add-on,   AD        05/19/23 2044   05/19/23 2041  Culture, blood (Routine X 2) w Reflex to ID Panel  BLOOD CULTURE X 2,   STAT      05/19/23 2041   05/19/23 2041  Urine Culture (for pregnant, neutropenic or urologic patients or patients with an indwelling urinary catheter)  (Urine Labs)  Once,   URGENT       Question:  Indication  Answer:  Altered mental status (if no other cause identified)   05/19/23 2041   05/19/23 1958  Sedimentation rate  Once,   URGENT        05/19/23 1957   05/19/23 1957  Lactic acid, plasma  (Lactic Acid)  STAT Now then every 3 hours,   STAT      05/19/23 1957   05/19/23 1956  D-dimer, quantitative  Once,   STAT        05/19/23 1957            Medications  pantoprazole (PROTONIX) injection 40 mg (40 mg  Intravenous Given 05/19/23 2005)  hydrALAZINE (APRESOLINE) injection 5 mg (has no administration in time range)  labetalol (NORMODYNE) injection 10 mg (10 mg Intravenous Given 05/19/23 2008)  lactated ringers bolus 1,000 mL (1,000 mLs Intravenous New Bag/Given 05/19/23 2024)  thiamine (VITAMIN B1) injection 100 mg (100 mg Intravenous Given 05/19/23 2013)    Radiological Exams on Admission: CT Head Wo Contrast  Result Date: 05/19/2023 CLINICAL DATA:  Altered mental status. EXAM: CT HEAD WITHOUT CONTRAST TECHNIQUE: Contiguous axial images were obtained from the base of the skull through the vertex without intravenous contrast. RADIATION DOSE REDUCTION: This exam was performed according to the departmental dose-optimization program which includes automated exposure control, adjustment of the mA and/or kV according to patient size and/or use of iterative reconstruction technique. COMPARISON:  Head CT dated 11/28/2022. FINDINGS: Brain: Mild age-related atrophy and moderate chronic microvascular ischemic changes. There is no acute intracranial hemorrhage. No mass effect or midline shift no extra-axial fluid collection. Vascular: Right sided aneurysm clip. Skull: No acute osseous pathology.  Right temporal craniotomy. Sinuses/Orbits: Extensive opacification of the paranasal sinuses new since the prior CTs with air-fluid levels in the maxillary sinuses. The mastoid air cells are clear. Other: None IMPRESSION: 1. No acute intracranial pathology. 2. Mild age-related atrophy and moderate chronic microvascular ischemic changes. 3. Extensive paranasal sinus disease. Electronically Signed   By: Elgie Collard M.D.   On: 05/19/2023 17:30     Data Reviewed: Relevant notes from primary care and specialist visits, past  discharge summaries as available in EHR, including Care Everywhere. Prior diagnostic testing as pertinent to current admission diagnoses Updated medications and problem lists for reconciliation ED  course, including vitals, labs, imaging, treatment and response to treatment Triage notes, nursing and pharmacy notes and ED provider's notes Notable results as noted in HPI  Assessment and Plan: * Confusion and disorientation D/d include dementia that is worsening.  UTI or any infectious  We will get CT for cva eval.  Fall/ aspiration precaution.    HTN (hypertension) Vitals:   05/19/23 2102 05/19/23 2110 05/19/23 2132 05/19/23 2218  BP: (!) 173/123 (!) 178/120 (!) 177/131 (!) 158/136   05/19/23 2220 05/19/23 2230 05/19/23 2240 05/19/23 2250  BP: (!) 181/148 (!) 168/138 (!) 180/126 (!) 156/109   05/19/23 2300 05/19/23 2310 05/20/23 0004 05/20/23 0042  BP: (!) 169/116 (!) 159/108 (!) 106/91 (!) 178/96  PRN hydralaizne.  Cont metoprolol  Eliquis continued.    AKI (acute kidney injury) [facility]) Lab Results  Component Value Date   CREATININE 0.83 05/19/2023   CREATININE 0.99 12/02/2022   CREATININE 1.28 (H) 12/01/2022  Resolved we will follow and manage,.   Stroke [facility]) H/o stroke but no deficits at baseline.  Repeat ct head noncontast.    Atrial fibrillation, chronic (HCC) Pt In chronic A.Fib and cont medications. EKG as needed.   Hypomagnesemia Ordered.   Hypokalemia Resolved. Will follow.    Tobacco abuse Nicotine patch.    DVT prophylaxis:  Heparin   Consults:  None   Advance Care Planning:    Code Status: Full Code   Family Communication:  None   Disposition Plan:  None   Severity of Illness: The appropriate patient status for this patient is OBSERVATION. Observation status is judged to be reasonable and necessary in order to provide the required intensity of service to ensure the patient's safety. The patient's presenting symptoms, physical exam findings, and initial radiographic and laboratory data in the context of their medical condition is felt to place them at decreased risk for further clinical deterioration. Furthermore, it is anticipated  that the patient will be medically stable for discharge from the hospital within 2 midnights of admission.   Author: Gertha Calkin, MD 05/20/2023 1:33 AM  For on call review [email or web address].

## 2023-05-19 NOTE — ED Provider Notes (Signed)
[facility] Provider Note    Event Date/Time   First MD Initiated Contact with Patient 05/19/23 1604     (approximate)   History   Altered Mental Status   HPI  Ana Herrera is a 67 y.o. female past medical history significant for atrial fibrillation on Eliquis, hypertension, gallbladder mass, presents to the emergency department for altered mental status.  Patient has been having some mild confusion over the past couple of months but over the past 3 days has had progressively worsening altered mental status.  Lives with her son and his wife.  States that she has been confused and not oriented.  Significantly more confused today and did not recognize or know the name of her granddaughter or where she was.  Slightly more with it since arriving to the emergency department.  Similar episode a couple of months ago when she was evaluated in the emergency department and had abnormalities of her LFTs and her ammonia level at that time.  Denies any headache.  Denies any change in vision.  No numbness or weakness at this time.  Denies any chest pain or shortness of breath.  Denies any abdominal pain, nausea, vomiting or diarrhea.  No blood in her stool.  Denies any dysuria.  Denies new medications.  Son arrived and stated that she has had some intermittent confusion since her hospitalization 6 months ago and will intermittently say strange things however she has had a significant decline in change of her mental status over the past 24 to 48 hours.  Went to the bank and was arguing that it was July and she needed to get money.  Did not recognize her granddaughter earlier today.  States that she only eats a very small amount of food.  Will sometimes only eat 1 saltine cracker for the day.  Will sometimes make 1 peanut butter and jelly sandwich last for 3 days.  States that she has plenty of access to food however she just does not want to eat.  Significant weight loss of  approximately 150 pounds over the past couple of years.     Physical Exam   Triage Vital Signs: ED Triage Vitals  Encounter Vitals Group     BP 05/19/23 1301 (!) 165/98     Systolic BP Percentile --      Diastolic BP Percentile --      Pulse Rate 05/19/23 1300 60     Resp 05/19/23 1301 18     Temp 05/19/23 1300 98.1 F (36.7 C)     Temp Source 05/19/23 1300 Oral     SpO2 05/19/23 1301 98 %     Weight --      Height --      Head Circumference --      Peak Flow --      Pain Score --      Pain Loc --      Pain Education --      Exclude from Growth Chart --     Most recent vital signs: Vitals:   05/19/23 1301 05/19/23 1915  BP: (!) 165/98 (!) 166/123  Pulse:  88  Resp: 18 15  Temp:  97.7 F (36.5 C)  SpO2: 98% 97%    Physical Exam Constitutional:      Appearance: She is well-developed.  HENT:     Head: Atraumatic.  Eyes:     Conjunctiva/sclera: Conjunctivae normal.  Cardiovascular:     Rate and Rhythm: Regular rhythm.  Pulmonary:     Effort: No respiratory distress.  Abdominal:     General: There is no distension.  Musculoskeletal:        General: Normal range of motion.     Cervical back: Normal range of motion.  Skin:    General: Skin is warm.  Neurological:     Mental Status: She is alert. Mental status is at baseline.     GCS: GCS eye subscore is 4. GCS verbal subscore is 5. GCS motor subscore is 6.     Cranial Nerves: Cranial nerves 2-12 are intact.     Sensory: Sensation is intact.     Motor: Motor function is intact.     Coordination: Coordination is intact.     Gait: Gait is intact.     IMPRESSION / MDM / ASSESSMENT AND PLAN / ED COURSE  I reviewed the triage vital signs and the nursing notes.  Differential diagnosis including electrolyte abnormalities, dehydration, urinary tract infection, intracranial hemorrhage, polypharmacy, delirium.  Patient has a nonfocal neurologic exam at this time, have a low suspicion for acute CVA.  On chart  review patient had a history of an brain aneurysm with coil and is unable to obtain MRIs.  EKG  I, Corena Herter, the attending physician, personally viewed and interpreted this ECG.   Rate: Normal  Rhythm: Atrial fibrillation  Axis: Normal  Intervals: Normal  ST&T Change: Nonspecific ST changes  atrial fibrillation while on cardiac telemetry.  RADIOLOGY my interpretation of imaging: CT scan of the head without signs of intracranial hemorrhage or infarction.  LABS (all labs ordered are listed, but only abnormal results are displayed) Labs interpreted as -    Labs Reviewed  COMPREHENSIVE METABOLIC PANEL - Abnormal; Notable for the following components:      Result Value   Glucose, Bld 114 (*)    AST 13 (*)    All other components within normal limits  URINALYSIS, W/ REFLEX TO CULTURE (INFECTION SUSPECTED) - Abnormal; Notable for the following components:   Color, Urine YELLOW (*)    APPearance HAZY (*)    Bacteria, UA RARE (*)    All other components within normal limits  SARS CORONAVIRUS 2 BY RT PCR  CBC  AMMONIA  D-DIMER, QUANTITATIVE  LACTIC ACID, PLASMA  LACTIC ACID, PLASMA  SEDIMENTATION RATE  CBG MONITORING, ED  TROPONIN I (HIGH SENSITIVITY)     MDM    No significant leukocytosis or anemia.  Creatinine at baseline.  No significant electrolyte abnormalities.  Normal LFTs.  Normal ammonia level.  COVID testing negative.  CT scan of the head without signs of intracranial hemorrhage or infarction.  No chest pain or shortness of breath have a low suspicion for ACS or pneumonia.  Lungs are clear to auscultation.  No findings of urinary tract infection.   Continues to be significantly hypertensive with a significantly elevated diastolic blood pressure and narrow pulse pressure.  Same on both arms.  No abdominal pain.  Unable to get an MRI to further evaluate for CVA.  Possible PRESS syndrome.  Given 1 dose of IV labetalol.  Consulted and discussed the case with  hospitalist.  Patient will be admitted to the hospitalist for altered mental status    PROCEDURES:  Critical Care performed: No  Procedures  Patient's presentation is most consistent with acute presentation with potential threat to life or bodily function.   MEDICATIONS ORDERED IN ED: Medications  lactated ringers bolus 1,000 mL (has no administration in time range)  pantoprazole (PROTONIX) injection 40 mg (40 mg Intravenous Given 05/19/23 2005)  thiamine (VITAMIN B1) injection 100 mg (has no administration in time range)  labetalol (NORMODYNE) injection 10 mg (10 mg Intravenous Given 05/19/23 2008)    FINAL CLINICAL IMPRESSION(S) / ED DIAGNOSES   Final diagnoses:  Altered mental status, unspecified altered mental status type  Delirium     Rx / DC Orders   ED Discharge Orders     None        Note:  This document was prepared using Dragon voice recognition software and may include unintentional dictation errors.   Corena Herter, MD 05/19/23 2013

## 2023-05-19 NOTE — ED Notes (Signed)
Attempted BC multiple times on patient and unable to get blood. Called lab at this time and asked if someone could come attempted obtaining BC.

## 2023-05-19 NOTE — ED Notes (Signed)
Pt gave urine sample, not enough for analysis. Pt will reattempt

## 2023-05-19 NOTE — Hospital Course (Signed)
[facility] Admission 6 months ago choledocholithiasis.

## 2023-05-19 NOTE — ED Notes (Signed)
Admitting MD at bedside.

## 2023-05-19 NOTE — ED Triage Notes (Signed)
Pt to ED via POV from home. Family reports last few days pt has been delirious, paranoid and confused. Family states 4 months ago she had a similar issues that was gallbladder and ammonia related. Family also reports pt has been refusing to eat. Pt alert to self and place on arrival.

## 2023-05-20 ENCOUNTER — Inpatient Hospital Stay
Admit: 2023-05-20 | Discharge: 2023-05-20 | Disposition: A | Discharge status: Home / Self Care | Payer: Medicare Other | Attending: Internal Medicine | Admitting: Internal Medicine

## 2023-05-20 ENCOUNTER — Encounter (HOSPITAL_COMMUNITY): Payer: Self-pay

## 2023-05-20 ENCOUNTER — Other Ambulatory Visit: Payer: Self-pay

## 2023-05-20 ENCOUNTER — Inpatient Hospital Stay: Payer: Medicare Other

## 2023-05-20 ENCOUNTER — Observation Stay: Payer: Medicare Other

## 2023-05-20 DIAGNOSIS — Z681 Body mass index (BMI) 19 or less, adult: Secondary | ICD-10-CM | POA: Diagnosis not present

## 2023-05-20 DIAGNOSIS — M109 Gout, unspecified: Secondary | ICD-10-CM | POA: Diagnosis present

## 2023-05-20 DIAGNOSIS — R627 Adult failure to thrive: Secondary | ICD-10-CM | POA: Diagnosis present

## 2023-05-20 DIAGNOSIS — R410 Disorientation, unspecified: Secondary | ICD-10-CM | POA: Diagnosis present

## 2023-05-20 DIAGNOSIS — Z79899 Other long term (current) drug therapy: Secondary | ICD-10-CM | POA: Diagnosis not present

## 2023-05-20 DIAGNOSIS — E440 Moderate protein-calorie malnutrition: Secondary | ICD-10-CM | POA: Diagnosis present

## 2023-05-20 DIAGNOSIS — G2581 Restless legs syndrome: Secondary | ICD-10-CM | POA: Diagnosis present

## 2023-05-20 DIAGNOSIS — Z8673 Personal history of transient ischemic attack (TIA), and cerebral infarction without residual deficits: Secondary | ICD-10-CM | POA: Diagnosis not present

## 2023-05-20 DIAGNOSIS — R000 Tachycardia, unspecified: Secondary | ICD-10-CM | POA: Diagnosis not present

## 2023-05-20 DIAGNOSIS — Z7901 Long term (current) use of anticoagulants: Secondary | ICD-10-CM | POA: Diagnosis not present

## 2023-05-20 DIAGNOSIS — R4182 Altered mental status, unspecified: Secondary | ICD-10-CM | POA: Diagnosis present

## 2023-05-20 DIAGNOSIS — F0390 Unspecified dementia without behavioral disturbance: Secondary | ICD-10-CM | POA: Diagnosis present

## 2023-05-20 DIAGNOSIS — Z87891 Personal history of nicotine dependence: Secondary | ICD-10-CM | POA: Diagnosis not present

## 2023-05-20 DIAGNOSIS — N179 Acute kidney failure, unspecified: Secondary | ICD-10-CM | POA: Diagnosis present

## 2023-05-20 DIAGNOSIS — F1011 Alcohol abuse, in remission: Secondary | ICD-10-CM | POA: Diagnosis present

## 2023-05-20 DIAGNOSIS — Z1152 Encounter for screening for COVID-19: Secondary | ICD-10-CM | POA: Diagnosis not present

## 2023-05-20 DIAGNOSIS — E876 Hypokalemia: Secondary | ICD-10-CM | POA: Diagnosis present

## 2023-05-20 DIAGNOSIS — I10 Essential (primary) hypertension: Secondary | ICD-10-CM | POA: Diagnosis present

## 2023-05-20 DIAGNOSIS — I482 Chronic atrial fibrillation, unspecified: Secondary | ICD-10-CM | POA: Diagnosis present

## 2023-05-20 DIAGNOSIS — Z8249 Family history of ischemic heart disease and other diseases of the circulatory system: Secondary | ICD-10-CM | POA: Diagnosis not present

## 2023-05-20 LAB — VITAMIN B12: Vitamin B-12: 112 pg/mL — ABNORMAL LOW (ref 180–914)

## 2023-05-20 LAB — LIPID PANEL
Cholesterol: 168 mg/dL (ref 0–200)
HDL: 65 mg/dL (ref >40)
LDL Cholesterol: 89 mg/dL (ref 0–99)
Total CHOL/HDL Ratio: 2.6 ratio
Triglycerides: 68 mg/dL (ref <150)
VLDL: 14 mg/dL (ref 0–40)

## 2023-05-20 MED ORDER — METOPROLOL TARTRATE 5 MG/5ML IV SOLN
5.0000 mg | Freq: Once | INTRAVENOUS | Status: AC
  Administered 2023-05-20: 5 mg via INTRAVENOUS
  Filled 2023-05-20: qty 5

## 2023-05-20 MED ORDER — NICOTINE 21 MG/24HR TD PT24
21.0000 mg | MEDICATED_PATCH | Freq: Every day | TRANSDERMAL | Status: DC
  Administered 2023-05-20 – 2023-05-23 (×4): 21 mg via TRANSDERMAL
  Filled 2023-05-20 (×4): qty 1

## 2023-05-20 MED ORDER — ENSURE ENLIVE PO LIQD
237.0000 mL | Freq: Three times a day (TID) | ORAL | Status: DC
  Administered 2023-05-20 – 2023-05-22 (×2): 237 mL via ORAL

## 2023-05-20 MED ORDER — DILTIAZEM HCL 30 MG PO TABS
60.0000 mg | ORAL_TABLET | Freq: Once | ORAL | Status: AC
  Administered 2023-05-20: 60 mg via ORAL
  Filled 2023-05-20: qty 2

## 2023-05-20 MED ORDER — METOPROLOL TARTRATE 5 MG/5ML IV SOLN
5.0000 mg | Freq: Four times a day (QID) | INTRAVENOUS | Status: DC | PRN
  Administered 2023-05-20 – 2023-05-22 (×4): 5 mg via INTRAVENOUS
  Filled 2023-05-20 (×3): qty 5

## 2023-05-20 MED ORDER — ADULT MULTIVITAMIN W/MINERALS CH
1.0000 | ORAL_TABLET | Freq: Every day | ORAL | Status: DC
  Administered 2023-05-20 – 2023-05-23 (×4): 1 via ORAL
  Filled 2023-05-20 (×4): qty 1

## 2023-05-20 MED ORDER — METOPROLOL TARTRATE 5 MG/5ML IV SOLN
5.0000 mg | Freq: Once | INTRAVENOUS | Status: DC
  Filled 2023-05-20: qty 5

## 2023-05-20 MED ORDER — HALOPERIDOL LACTATE 5 MG/ML IJ SOLN
2.0000 mg | Freq: Once | INTRAMUSCULAR | Status: AC
  Administered 2023-05-20: 2 mg via INTRAVENOUS
  Filled 2023-05-20: qty 1

## 2023-05-20 MED ORDER — DILTIAZEM HCL ER COATED BEADS 240 MG PO CP24
240.0000 mg | ORAL_CAPSULE | Freq: Every day | ORAL | Status: DC
  Administered 2023-05-21 – 2023-05-23 (×3): 240 mg via ORAL
  Filled 2023-05-20: qty 2
  Filled 2023-05-20: qty 1
  Filled 2023-05-20: qty 2

## 2023-05-20 NOTE — Evaluation (Signed)
Speech Language Pathology Evaluation Patient Details Name: Ana Herrera MRN: [id number] DOB: 1956/08/12 Today's Date: 05/20/2023 Time: 2353-6144 SLP Time Calculation (min) (ACUTE ONLY): 22 min  Problem List:  Patient Active Problem List   Diagnosis Date Noted   AMS (altered mental status) 05/20/2023   Confusion and disorientation 05/19/2023   Encounter for removal of biliary stent 02/01/2023   Cholangitis 11/29/2022   Tobacco abuse 11/29/2022   Hyponatremia 11/29/2022   Elevated alkaline phosphatase level 11/29/2022   Leukocytosis 11/29/2022   Nodule of middle lobe of right lung 11/29/2022   Jaundice 11/29/2022   Abdominal pain 11/28/2022   HTN (hypertension) 11/28/2022   Stroke (HCC) 11/28/2022   Gout 11/28/2022   Abnormal LFTs 11/28/2022   AKI (acute kidney injury) (HCC) 11/28/2022   Atrial fibrillation, chronic (HCC) 11/28/2022   Supratherapeutic INR 11/28/2022   Choledocholithiasis 11/28/2022   Microcytic anemia 11/28/2022   Hypokalemia 11/28/2022   Hypomagnesemia 11/28/2022   Past Medical History:  Past Medical History:  Diagnosis Date   Alcohol abuse, in remission    Cerebral aneurysm    Chronic atrial fibrillation (HCC)    Gout    HTN (hypertension)    RLS (restless legs syndrome)    SAH (subarachnoid hemorrhage) (HCC)    Stroke (HCC)    Tobacco abuse    UTI (urinary tract infection)    Past Surgical History:  Past Surgical History:  Procedure Laterality Date   CEREBRAL ANEURYSM REPAIR     with clip per her son   ERCP N/A 11/29/2022   Procedure: ENDOSCOPIC RETROGRADE CHOLANGIOPANCREATOGRAPHY (ERCP);  Surgeon: Midge Minium, MD;  Location: [facility] ENDOSCOPY;  Service: Endoscopy;  Laterality: N/A;   ERCP N/A 02/01/2023   Procedure: ENDOSCOPIC RETROGRADE CHOLANGIOPANCREATOGRAPHY (ERCP);  Surgeon: Midge Minium, MD;  Location: [facility] ENDOSCOPY;  Service: Endoscopy;  Laterality: N/A;  STENT REMOVAL   HPI:  Ana Herrera is a 67 y.o. female past medical  history significant for atrial fibrillation on Eliquis, hypertension, gallbladder mass, presents to the emergency department for altered mental status.  Patient has been having some mild confusion over the past couple of months but over the past 3 days has had progressively worsening altered mental status.  Lives with her son and his wife. Son arrived and stated that she has had some intermittent confusion since her hospitalization 6 months ago and will intermittently say strange things however she has had a significant decline in change of her mental status over the past 24 to 48 hours.  Went to the bank and was arguing that it was July and she needed to get money.  Did not recognize her granddaughter earlier today.  States that she only eats a very small amount of food.  Will sometimes only eat 1 saltine cracker for the day.  Will sometimes make 1 peanut butter and jelly sandwich last for 3 days.  States that she has plenty of access to food however she just does not want to eat.  Significant weight loss of approximately 150 pounds over the past couple of years.    Brain: Mild age-related atrophy and moderate chronic microvascular  ischemic changes. There is no acute intracranial hemorrhage. No mass  effect or midline shift no extra-axial fluid collection.      Vascular: Right sided aneurysm clip; Right temporal craniotomy.   Assessment / Plan / Recommendation Clinical Impression  Interview with pt's family reveal pt has history of 30+ years of alcohol abuse,  brain aneurysm in 2005, stroke in 2012, and  Atrial fibrillation diagnosed in 2018.   In revealing pt's chart the following cognitive information was found:     01/23/2018:  Montreal Cognitive Assessment: Assesses mild cognitive dysfunction in cognitive domains of attention and concentration, executive functions, memory, language, visual-constructional skills, conceptual thinking, calculations, and orientation. A high score indicates better  cognitive function.   Category Score   Visuospatial/Executive 4/5   Naming 3/5   Attention - Repeat Digits 2/2   Attention - List of Letters 1/1   Attention - Serial 7 3/3   Language- Repeat Sentences 0/2   Language - Word naming 0/1   Abstraction 1/2   Delayed Recall 5/5   Orientation 6/6   Total Score 25/30  Interpretation: Mild Cognitive impairment   Trail Making Test: Provides information about visual search speed, scanning, speed of processing, mental flexibility, visual-motor tracking, and executive functioning skills. Lower scores indicate better performance, while higher scores indicate poor cognitive flexibility and working memory.   Part A: 38.04 seconds (Mild Impairment)   Part B: 96 seconds (Mild Impairment)   Interpretation: Mild cognitive impairment     The [place] Maze Task is a timed pencil and paper screening test of the cognitive domains of attention, visuoconstructional skills, and executive functions of planning and foresight, all of which are required for safe driving. Results Interpretation   Time: 46.39 seconds Errors: 0 errors Completed in up to 46 seconds with 0 errors. Participant is likely to have adequate capacity in the cognitive domains of attention, visuoconstructional skills and executive functions of planning and foresight.  *Results of 60 seconds or less, with no errors then participant is likely to have adequate capacity in the cognitive domains of attention, visuoconstructional skills, and executive functions of planning and foresight, to drive safely.  At this time, pt presents with severe cognitive impairment in the areas of attention, memory, problem solving, awareness and safety. Pt stated that it was December and then self-corrected with November (unaware that it was August or summer). She is tangential and confabulatory in her responses with intermittent semantic paraphasias noted. Recommend 24 hour supervision at discharge and spoke with her family  about Outpatient ST as well as follow up neurology for information/management of suspected dementia.     SLP Assessment  SLP Recommendation/Assessment: All further Speech Lanaguage Pathology  needs can be addressed in the next venue of care SLP Visit Diagnosis: Cognitive communication deficit (R41.841)    Recommendations for follow up therapy are one component of a multi-disciplinary discharge planning process, led by the attending physician.  Recommendations may be updated based on patient status, additional functional criteria and insurance authorization.    Follow Up Recommendations  Outpatient SLP    Assistance Recommended at Discharge  Frequent or constant Supervision/Assistance  Functional Status Assessment Patient has had a recent decline in their functional status and/or demonstrates limited ability to make significant improvements in function in a reasonable and predictable amount of time  Frequency and Duration     N/A      SLP Evaluation Cognition  Overall Cognitive Status: Impaired/Different from baseline Arousal/Alertness: Awake/alert Orientation Level: Oriented to person;Oriented to place;Oriented to situation;Disoriented to time Month: December Day of Week: Correct Attention: Sustained Sustained Attention: Impaired Sustained Attention Impairment: Verbal basic;Functional basic Memory: Impaired Memory Impairment: Storage deficit;Retrieval deficit;Decreased recall of new information;Decreased short term memory;Prospective memory Decreased Short Term Memory: Verbal basic;Functional basic Awareness: Impaired Awareness Impairment: Intellectual impairment;Emergent impairment;Anticipatory impairment Problem Solving: Impaired Problem Solving Impairment: Verbal complex;Functional complex Executive Function:  (all impaired  by lower level deficits) Behaviors: Restless;Impulsive Safety/Judgment: Impaired       Comprehension  Auditory Comprehension Overall Auditory  Comprehension: Appears within functional limits for tasks assessed (impaired by lower level deficits) Reading Comprehension Reading Status: Not tested    Expression Expression Primary Mode of Expression: Verbal Verbal Expression Overall Verbal Expression: Impaired (d/t lower level cognitive deficits) Written Expression Dominant Hand: Right Written Expression: Not tested   Oral / Motor  Oral Motor/Sensory Function Overall Oral Motor/Sensory Function: Within functional limits Motor Speech Overall Motor Speech: Appears within functional limits for tasks assessed (pt with no focal deficits pt overal mildly slurred speech)           Rex Oesterle B. Dreama Saa, M.S., CCC-SLP, Tree surgeon Certified Brain Injury Specialist [facility]  [facility] Office [phone] Ascom [phone] Fax [phone]

## 2023-05-20 NOTE — Assessment & Plan Note (Signed)
Resolved. Will follow.  

## 2023-05-20 NOTE — Evaluation (Signed)
Occupational Therapy Evaluation Patient Details Name: Ana Herrera MRN: [id number] DOB: Dec 10, 1955 Today's Date: 05/20/2023   History of Present Illness 67 y/o female presented to ED on 05/19/23 for delirium, paranoid, and confused. PMH: HTN, chronic Afib, cerebral aneurysm s/p clip, restless leg syndrome.   Clinical Impression   Pt was seen for OT evaluation this date. Prior to hospital admission, pt reports being independent with ADL, med mgt, and generally holding onto or reaching for furniture while ambulating at home. She lives in the basement of her son's home and does not frequently go upstairs. Pt denies complaints, oriented to self, place, and month/date (looks at whiteboard in room for visual cue). Able to follow simple commands well. Pleasant throughout. Pt presents to acute OT demonstrating mild impaired ADL performance and functional mobility 2/2 decreased balance, safety awareness, and HR ranging from 100-130's with mobility (See OT problem list for additional functional deficits). Pt currently requires supervision for bathing, dressing, and medication management to maximize safety.  Pt would benefit from skilled OT services to address noted impairments and functional limitations (see below for any additional details) in order to maximize safety and independence while minimizing falls risk and caregiver burden.     If plan is discharge home, recommend the following: A little help with walking and/or transfers;A little help with bathing/dressing/bathroom;Assistance with cooking/housework;Help with stairs or ramp for entrance;Assist for transportation;Direct supervision/assist for medications management    Functional Status Assessment  Patient has had a recent decline in their functional status and demonstrates the ability to make significant improvements in function in a reasonable and predictable amount of time.  Equipment Recommendations  None recommended by OT     Recommendations for Other Services       Precautions / Restrictions Precautions Precautions: Fall Restrictions Weight Bearing Restrictions: No      Mobility Bed Mobility Overal bed mobility: Independent                  Transfers Overall transfer level: Independent Equipment used: None                      Balance Overall balance assessment: Mild deficits observed, not formally tested                                         ADL either performed or assessed with clinical judgement   ADL                                         General ADL Comments: Pt able to complete LB dressing without difficulty. Handheld assist for mobility as she tends to hold onto doorways, walls, and railings otherwise. Pt requires supervision for safety for ADL tasks at this time.     Vision         Perception         Praxis         Pertinent Vitals/Pain Pain Assessment Pain Assessment: No/denies pain     Extremity/Trunk Assessment Upper Extremity Assessment Upper Extremity Assessment: Overall WFL for tasks assessed   Lower Extremity Assessment Lower Extremity Assessment: Overall WFL for tasks assessed   Cervical / Trunk Assessment Cervical / Trunk Assessment: Normal   Communication Communication Communication: No apparent difficulties   Cognition Arousal: Alert Behavior  During Therapy: WFL for tasks assessed/performed Overall Cognitive Status: No family/caregiver present to determine baseline cognitive functioning                                 General Comments: Pt oriented x3 with visual cues of whiteboard for month/year, unclear why she is here. Follows commands well.     General Comments  HR in 100's-130's with mobility. MD and RN notified.    Exercises Other Exercises Other Exercises: Pt ambulated ~160' with handheld assist and no overt LOB.   Shoulder Instructions      Home Living Family/patient  expects to be discharged to:: Private residence Living Arrangements: Children Available Help at Discharge: Family;Available 24 hours/day Type of Home: House       Home Layout: Two level (lives in basement and doesn't frequent the stairs. Family brings meals to her often) Alternate Level Stairs-Number of Steps: flight Alternate Level Stairs-Rails: Right Bathroom Shower/Tub: Producer, television/film/video: Handicapped height            Lives With: Family    Prior Functioning/Environment Prior Level of Function : Independent/Modified Independent;Driving                        OT Problem List: Decreased cognition;Cardiopulmonary status limiting activity;Impaired balance (sitting and/or standing);Decreased knowledge of use of DME or AE;Decreased safety awareness      OT Treatment/Interventions: Self-care/ADL training;Therapeutic exercise;Therapeutic activities;Cognitive remediation/compensation;DME and/or AE instruction;Patient/family education;Balance training    OT Goals(Current goals can be found in the care plan section) Acute Rehab OT Goals Patient Stated Goal: go home OT Goal Formulation: With patient Time For Goal Achievement: 06/03/23 Potential to Achieve Goals: Good ADL Goals Additional ADL Goal #1: Pt will complete all aspects of bathing with remote supervision for safety, 2/2 opportunities. Additional ADL Goal #2: Pt will complete medication set up using weekly pill box with 100% accuracy, 3/3 opportunities.  OT Frequency: Min 1X/week    Co-evaluation              AM-PAC OT "6 Clicks" Daily Activity     Outcome Measure Help from another person eating meals?: None Help from another person taking care of personal grooming?: None Help from another person toileting, which includes using toliet, bedpan, or urinal?: A Little Help from another person bathing (including washing, rinsing, drying)?: A Little Help from another person to put on and taking off  regular upper body clothing?: None Help from another person to put on and taking off regular lower body clothing?: A Little 6 Click Score: 21   End of Session Nurse Communication: Mobility status;Other (comment) (bed alarm placed on since no family in room)  Activity Tolerance: Patient tolerated treatment well Patient left: in bed;with bed alarm set;with call bell/phone within reach (seated EOB eating)  OT Visit Diagnosis: Other abnormalities of gait and mobility (R26.89)                Time: 3244-0102 OT Time Calculation (min): 14 min Charges:  OT General Charges $OT Visit: 1 Visit OT Evaluation $OT Eval Low Complexity: 1 Low OT Treatments $Therapeutic Activity: 8-22 mins  Arman Filter., MPH, MS, OTR/L ascom [phone] 05/20/23, 5:06 PM

## 2023-05-20 NOTE — Progress Notes (Signed)
       CROSS COVER NOTE  NAME: Ana Herrera MRN: [id number] DOB : 06-03-1956    Concern as stated by nurse / staff   Gershon Crane , this patient has a history of Afib, takes eliquis and has been afib RVR since I have been here. they gave 1 one time 5mg  lopressor IV. I did get an EKG at the start of shift showing afib RVR but the rate captured was 110. Pt is in bed and HR is bouncing between 130s-160s.  67 y.o. female with medical history significant for hypertension, chronic A-fib, cerebral aneurysm status post history of clip, restless leg syndrome, tobacco abuse brought by family paranoia confusion and delirious behavior for the past few days patient had a similar episode when she had gallbladder issues that was said to be because of the ammonia patient has been not eating cooperating with family members.   Repeat dose of 5 mg iv metoprolol x1 PT HR not responding much to the lopressor , current HR getting into low 160s' bouncing between 120's-160's.   Pertinent findings on chart review: History meds reviewed. On diltiazem at home for rate control.   Assessment and  Interventions   Assessment:    05/20/2023   11:20 PM 05/20/2023    9:12 PM 05/20/2023    7:25 PM  Vitals with BMI  Systolic 149 151 119  Diastolic 114 104 147  Pulse  110 73   Bedside patient denies heart palpitaitons of chest pain. She has no complaints. No signs of distress.  Plan: Diltiazem 60 po now Cardizem cd 240 ordered from home list to start tomorrow Continue eliquis previously ordred       Donnie Mesa NP [facility] Cross Cover 7pm-7am - check amion for availability Pager [phone]

## 2023-05-20 NOTE — Assessment & Plan Note (Signed)
Pt In chronic A.Fib and cont medications. EKG as needed.

## 2023-05-20 NOTE — Assessment & Plan Note (Signed)
Lab Results  Component Value Date   CREATININE 0.83 05/19/2023   CREATININE 0.99 12/02/2022   CREATININE 1.28 (H) 12/01/2022  Resolved we will follow and manage,.

## 2023-05-20 NOTE — Progress Notes (Signed)
Progress Note   Patient: Ana Herrera ZOX:[id number] DOB: [birth year] DOA: 05/19/2023     0 DOS: the patient was seen and examined on 05/20/2023   Brief hospital course:  67 y.o. female with medical history significant for hypertension, chronic A-fib, cerebral aneurysm status post history of clip, restless leg syndrome, tobacco abuse brought by family paranoia confusion and delirious behavior for the past few days patient had a similar episode when she had gallbladder issues that was said to be because of the ammonia patient has been not eating cooperating with family members.  Patient is awake and alert to herself and where she is on arrival per nurses report.  Patient's symptoms of confusion have been progressively worsening over the past few months as well.  Patient did not recognize her family members today family members son and granddaughter at bedside states that her intermittent confusion has been going on for about 6 months or she is saying strange things and confabulating, significant weight loss of 150 pounds over the past few years.  Clinical presentation of failure to thrive and dementia plus worsening. In the emergency room initial EKG A-fib with no ST-T wave changes.  Head CT negative for any intracranial hemorrhage or infarct.  Lab work shows glucose 114 normal kidney function normal electrolytes normal ammonia level normal CBC.   8/16 : Patient symptomatically improved CT head pending.  Likely has underlying dementia as per son.  Will need appetite stimulant and dementia medication in outpatient setting.  Assessment and Plan: * Confusion and disorientation : Patient likely has underlying dementia now worsening.  CT head pending. UTI or any infectious   Fall/ aspiration precaution.   HTN (hypertension) : Continues to be hypertensive.  Cont metoprolol  Eliquis continued.   AKI (acute kidney injury)  Lab Results  Component Value Date   CREATININE 0.83 05/19/2023   CREATININE  0.99 12/02/2022   CREATININE 1.28 (H) 12/01/2022  Resolved we will follow and manage,.  Stroke  H/o stroke but no deficits at baseline.  Repeat ct head noncontast.   Atrial fibrillation, chronic ( Pt In chronic A.Fib and cont medications. EKG as needed.   Hypomagnesemia Ordered.   Hypokalemia Resolved. Will follow.   Tobacco abuse Nicotine patch.      Subjective: Patient is symptomatically feeling better.  No overnight events.  Physical Exam: Vitals:   05/20/23 0748 05/20/23 1208 05/20/23 1456 05/20/23 1500  BP: (!) 148/87 (!) 144/80 (!) 150/109   Pulse: 81 79 74   Resp: 16 18    Temp: 98.4 F (36.9 C) 97.7 F (36.5 C)    TempSrc:  Oral    SpO2: 97% 97%    Weight:    55.8 kg   Physical Exam HENT:     Head: Normocephalic and atraumatic.     Nose: Nose normal.  Eyes:     Extraocular Movements: Extraocular movements intact.     Pupils: Pupils are equal, round, and reactive to light.  Cardiovascular:     Rate and Rhythm: Normal rate and regular rhythm.  Pulmonary:     Effort: Pulmonary effort is normal.     Breath sounds: Normal breath sounds.  Abdominal:     General: There is no distension.  Musculoskeletal:        General: Normal range of motion.     Cervical back: Normal range of motion.  Skin:    General: Skin is warm.  Neurological:     General: No focal deficit present.  Mental Status: She is alert.  Psychiatric:        Mood and Affect: Mood normal.        Behavior: Behavior normal.     Data Reviewed:  There are no new results to review at this time.  Family Communication: Son by bedside updated about the status  Disposition: Status is: Inpatient Remains inpatient appropriate because: AMS  Planned Discharge Destination: Home    Time spent: 31 minutes  Author: Kirstie Peri, MD 05/20/2023 3:16 PM  For on call review [email or web address].

## 2023-05-20 NOTE — Assessment & Plan Note (Signed)
H/o stroke but no deficits at baseline.  Repeat ct head noncontast.

## 2023-05-20 NOTE — Assessment & Plan Note (Signed)
-  Nicotine patch 

## 2023-05-20 NOTE — Assessment & Plan Note (Signed)
Ordered

## 2023-05-20 NOTE — Assessment & Plan Note (Signed)
Vitals:   05/19/23 2102 05/19/23 2110 05/19/23 2132 05/19/23 2218  BP: (!) 173/123 (!) 178/120 (!) 177/131 (!) 158/136   05/19/23 2220 05/19/23 2230 05/19/23 2240 05/19/23 2250  BP: (!) 181/148 (!) 168/138 (!) 180/126 (!) 156/109   05/19/23 2300 05/19/23 2310 05/20/23 0004 05/20/23 0042  BP: (!) 169/116 (!) 159/108 (!) 106/91 (!) 178/96  PRN hydralaizne.  Cont metoprolol  Eliquis continued.

## 2023-05-20 NOTE — Evaluation (Signed)
Physical Therapy Evaluation & Discharge Patient Details Name: Ana Herrera MRN: [id number] DOB: July 08, 1956 Today's Date: 05/20/2023  History of Present Illness  67 y/o female presented to ED on 05/19/23 for delirium, paranoid, and confused. PMH: HTN, chronic Afib, cerebral aneurysm s/p clip, restless leg syndrome.  Clinical Impression  Patient admitted with the above. PTA, patient lives with son, daughter in law, and grandchildren and reports independence with no AD. No family present to determine baseline cognition, however patient is A&Ox4 with some general confusion. Patient functioning at independent level with no AD. HR up to 160 with activity although asymptomatic. No further skilled PT needs identified acutely. PT will sign off.         If plan is discharge home, recommend the following: Supervision due to cognitive status   Can travel by private vehicle        Equipment Recommendations None recommended by PT  Recommendations for Other Services       Functional Status Assessment Patient has not had a recent decline in their functional status     Precautions / Restrictions Precautions Precautions: None Restrictions Weight Bearing Restrictions: No      Mobility  Bed Mobility Overal bed mobility: Independent                  Transfers Overall transfer level: Independent Equipment used: None                    Ambulation/Gait Ambulation/Gait assistance: Independent Gait Distance (Feet): 350 Feet Assistive device: None Gait Pattern/deviations: WFL(Within Functional Limits)   Gait velocity interpretation: >4.37 ft/sec, indicative of normal walking speed   General Gait Details: HR up to 160, asymptomatic  Stairs            Wheelchair Mobility     Tilt Bed    Modified Rankin (Stroke Patients Only)       Balance Overall balance assessment: Independent                                           Pertinent  Vitals/Pain Pain Assessment Pain Assessment: No/denies pain    Home Living Family/patient expects to be discharged to:: Private residence Living Arrangements: Children Available Help at Discharge: Family;Available 24 hours/day Type of Home: House       Alternate Level Stairs-Number of Steps: flight Home Layout: Two level (lives in basement and doesn't frequent the stairs. Family brings meals to her often)        Prior Function Prior Level of Function : Independent/Modified Independent;Driving                     Extremity/Trunk Assessment   Upper Extremity Assessment Upper Extremity Assessment: Overall WFL for tasks assessed    Lower Extremity Assessment Lower Extremity Assessment: Overall WFL for tasks assessed    Cervical / Trunk Assessment Cervical / Trunk Assessment: Normal  Communication   Communication Communication: No apparent difficulties  Cognition Arousal: Alert Behavior During Therapy: WFL for tasks assessed/performed Overall Cognitive Status: No family/caregiver present to determine baseline cognitive functioning                                 General Comments: seems generally confused but A&Ox4        General Comments  Exercises     Assessment/Plan    PT Assessment Patient does not need any further PT services  PT Problem List         PT Treatment Interventions      PT Goals (Current goals can be found in the Care Plan section)  Acute Rehab PT Goals Patient Stated Goal: to go home PT Goal Formulation: All assessment and education complete, DC therapy    Frequency       Co-evaluation               AM-PAC PT "6 Clicks" Mobility  Outcome Measure Help needed turning from your back to your side while in a flat bed without using bedrails?: None Help needed moving from lying on your back to sitting on the side of a flat bed without using bedrails?: None Help needed moving to and from a bed to a chair  (including a wheelchair)?: None Help needed standing up from a chair using your arms (e.g., wheelchair or bedside chair)?: None Help needed to walk in hospital room?: None Help needed climbing 3-5 steps with a railing? : None 6 Click Score: 24    End of Session   Activity Tolerance: Patient tolerated treatment well Patient left: in bed;with call bell/phone within reach Nurse Communication: Mobility status PT Visit Diagnosis: Muscle weakness (generalized) (M62.81)    Time: 4098-1191 PT Time Calculation (min) (ACUTE ONLY): 12 min   Charges:   PT Evaluation $PT Eval Low Complexity: 1 Low   PT General Charges $$ ACUTE PT VISIT: 1 Visit         Maylon Peppers, PT, DPT Physical Therapist - [facility]  [facility]   Dung Salinger A Trevyon Swor 05/20/2023, 9:12 AM

## 2023-05-20 NOTE — Progress Notes (Signed)
Patient is pleasantly confused, wondering around the room and unit. Hr elevating to 170's with activity and  120's 130's at rest, asymptomatic. Informed md.

## 2023-05-20 NOTE — Assessment & Plan Note (Signed)
D/d include dementia that is worsening.  UTI or any infectious  We will get CT for cva eval.  Fall/ aspiration precaution.

## 2023-05-20 NOTE — Progress Notes (Unsigned)
Attempted ECHO. Heart rate elevated. 130's-180's. Notified RN  Dondra Prader RVT RCS

## 2023-05-20 NOTE — Progress Notes (Signed)
Initial Nutrition Assessment  DOCUMENTATION CODES:   Non-severe (moderate) malnutrition in context of social or environmental circumstances  INTERVENTION:   -Obtain new wt -Ensure Enlive po TID, each supplement provides 350 kcal and 20 grams of protein -Magic cup TID with meals, each supplement provides 290 kcal and 9 grams of protein  -MVI with minerals daily -Continue regular diet  NUTRITION DIAGNOSIS:   Moderate Malnutrition related to social / environmental circumstances as evidenced by mild fat depletion, moderate fat depletion, mild muscle depletion, moderate muscle depletion.  GOAL:   Patient will meet greater than or equal to 90% of their needs  MONITOR:   PO intake, Supplement acceptance  REASON FOR ASSESSMENT:   Consult, Malnutrition Screening Tool Assessment of nutrition requirement/status  ASSESSMENT:   Pt with medical history significant for hypertension, chronic A-fib, cerebral aneurysm status post history of clip, restless leg syndrome, tobacco abuse brought by family paranoia confusion and delirious behavior for the past few days  Pt admitted with confusion and disorientation.   Spoke with pt and son at bedside. Pt son reports pt has experienced a general decline in health over the past 2-3 years. Pt currently lives in an in law suite in son's home, but pt is usually very reclusive. Pt does not eat food and rarely comes to eat dinner with the family, even when offered. Per pt son, pt is not interested in food and will often have one meal per day consisting of a few saltine crackers of a 1/4 of peanut butter sandwich.  Pt has means to purchase foods, but seems fixated on consuming very little. She denies any difficulty chewing or swallowing foods. Noted pt eating hash brown from [employer] without difficulty.   Per son, pt used to weight over 200# and has progressive decline to about 110-120#. He estimates she has lost about 10# within the past month. No wt since  02/01/23. Pt has been less active, often spending time indoors. Pt son concerned as activities that pt enjoys (such as reading), she now has little interest in. Son reports he has reported these concerns with PCP, but no treatment has been addressed. Per son, hospitalist has recommended appetite stimulant to help with appetite and depression.   Per son, pt has lived with him for the past 2 years. Prior to that, pt was living alone and lost her husband about 5 years ago. Son believes that grief from loss as well as transitions in living situation have contributed to decline. Pt also stopped driving to concerns of cognition and vision.   Discussed importance of good meal and supplement intake to promote healing. Pt son open to anything RD can offer; pt amenable to supplement. Per son, pt will often follow recommendations for a while, but becomes resistant to them afterwards.   Medications reviewed.   Labs reviewed.  NUTRITION - FOCUSED PHYSICAL EXAM:  Flowsheet Row Most Recent Value  Orbital Region Mild depletion  Upper Arm Region Moderate depletion  Thoracic and Lumbar Region Moderate depletion  Buccal Region Mild depletion  Temple Region Mild depletion  Clavicle Bone Region Moderate depletion  Clavicle and Acromion Bone Region Moderate depletion  Scapular Bone Region Moderate depletion  Dorsal Hand Moderate depletion  Patellar Region Moderate depletion  Anterior Thigh Region Moderate depletion  Posterior Calf Region Moderate depletion  Edema (RD Assessment) None  Hair Reviewed  Eyes Reviewed  Mouth Reviewed  Skin Reviewed  Nails Reviewed       Diet Order:   Diet  Order             Diet regular Room service appropriate? Yes; Fluid consistency: Thin  Diet effective now                   EDUCATION NEEDS:   Education needs have been addressed  Skin:  Skin Assessment: Reviewed RN Assessment  Last BM:  Unknown  Height:   Ht Readings from Last 1 Encounters:  02/01/23  5\' 7"  (1.702 m)    Weight:   Wt Readings from Last 1 Encounters:  02/01/23 56.2 kg    Ideal Body Weight:  61.4 kg  BMI:  There is no height or weight on file to calculate BMI.  Estimated Nutritional Needs:   Kcal:  1650-1850  Protein:  85-100 grams  Fluid:  > 1.6 L    Levada Schilling, RD, LDN, CDCES Registered Dietitian II Certified Diabetes Care and Education Specialist Please refer to [facility] for RD and/or RD on-call/weekend/after hours pager

## 2023-05-21 ENCOUNTER — Inpatient Hospital Stay (HOSPITAL_COMMUNITY)
Admit: 2023-05-21 | Discharge: 2023-05-21 | Disposition: A | Discharge status: Home / Self Care | Payer: Medicare Other | Attending: Internal Medicine | Admitting: Internal Medicine

## 2023-05-21 DIAGNOSIS — R410 Disorientation, unspecified: Secondary | ICD-10-CM | POA: Diagnosis not present

## 2023-05-21 DIAGNOSIS — I10 Essential (primary) hypertension: Secondary | ICD-10-CM

## 2023-05-21 LAB — ECHOCARDIOGRAM COMPLETE
AR max vel: 1.72 cm2
AV Area VTI: 1.87 cm2
AV Area mean vel: 1.75 cm2
AV Mean grad: 3 mmHg
AV Peak grad: 6.4 mmHg
Ao pk vel: 1.27 m/s
Area-P 1/2: 3.33 cm2
MV VTI: 1.49 cm2
S' Lateral: 3.4 cm
Weight: 1968.27 [oz_av]

## 2023-05-21 LAB — URINE CULTURE: Culture: <10000 — AB

## 2023-05-21 MED ORDER — PANTOPRAZOLE SODIUM 40 MG PO TBEC
40.0000 mg | DELAYED_RELEASE_TABLET | Freq: Two times a day (BID) | ORAL | Status: DC
  Administered 2023-05-21 – 2023-05-23 (×4): 40 mg via ORAL
  Filled 2023-05-21 (×4): qty 1

## 2023-05-21 NOTE — Progress Notes (Signed)
PHARMACIST - PHYSICIAN COMMUNICATION  CONCERNING: IV to Oral Route Change Policy  RECOMMENDATION: This patient is receiving pantoprazole by the intravenous route.  Based on criteria approved by the Pharmacy and Therapeutics Committee, the intravenous medication(s) is/are being converted to the equivalent oral dose form(s).  DESCRIPTION: These criteria include: The patient is eating (either orally or via tube) and/or has been taking other orally administered medications for a least 24 hours The patient has no evidence of active gastrointestinal bleeding or impaired GI absorption (gastrectomy, short bowel, patient on TNA or NPO).  If you have questions about this conversion, please contact the Pharmacy Department   Tressie Ellis, [facility] 05/21/2023 9:33 AM

## 2023-05-21 NOTE — Plan of Care (Signed)
Problem: Education: Goal: Knowledge of disease or condition will improve 05/21/2023 1405 by Jacki Cones, RN Outcome: Progressing 05/21/2023 1405 by Jacki Cones, RN Outcome: Progressing Goal: Knowledge of secondary prevention will improve (MUST DOCUMENT ALL) 05/21/2023 1405 by Jacki Cones, RN Outcome: Progressing 05/21/2023 1405 by Jacki Cones, RN Outcome: Progressing Goal: Knowledge of patient specific risk factors will improve Loraine Leriche N/A or DELETE if not current risk factor) 05/21/2023 1405 by Jacki Cones, RN Outcome: Progressing 05/21/2023 1405 by Jacki Cones, RN Outcome: Progressing   Problem: Ischemic Stroke/TIA Tissue Perfusion: Goal: Complications of ischemic stroke/TIA will be minimized 05/21/2023 1405 by Jacki Cones, RN Outcome: Progressing 05/21/2023 1405 by Jacki Cones, RN Outcome: Progressing   Problem: Coping: Goal: Will verbalize positive feelings about self 05/21/2023 1405 by Jacki Cones, RN Outcome: Progressing 05/21/2023 1405 by Jacki Cones, RN Outcome: Progressing Goal: Will identify appropriate support needs 05/21/2023 1405 by Jacki Cones, RN Outcome: Progressing 05/21/2023 1405 by Jacki Cones, RN Outcome: Progressing   Problem: Health Behavior/Discharge Planning: Goal: Ability to manage health-related needs will improve 05/21/2023 1405 by Jacki Cones, RN Outcome: Progressing 05/21/2023 1405 by Jacki Cones, RN Outcome: Progressing Goal: Goals will be collaboratively established with patient/family 05/21/2023 1405 by Jacki Cones, RN Outcome: Progressing 05/21/2023 1405 by Jacki Cones, RN Outcome: Progressing   Problem: Self-Care: Goal: Ability to participate in self-care as condition permits will improve 05/21/2023 1405 by Jacki Cones, RN Outcome: Progressing 05/21/2023 1405 by Jacki Cones, RN Outcome: Progressing Goal:  Verbalization of feelings and concerns over difficulty with self-care will improve 05/21/2023 1405 by Jacki Cones, RN Outcome: Progressing 05/21/2023 1405 by Jacki Cones, RN Outcome: Progressing Goal: Ability to communicate needs accurately will improve 05/21/2023 1405 by Jacki Cones, RN Outcome: Progressing 05/21/2023 1405 by Jacki Cones, RN Outcome: Progressing   Problem: Nutrition: Goal: Risk of aspiration will decrease 05/21/2023 1405 by Jacki Cones, RN Outcome: Progressing 05/21/2023 1405 by Jacki Cones, RN Outcome: Progressing Goal: Dietary intake will improve 05/21/2023 1405 by Jacki Cones, RN Outcome: Progressing 05/21/2023 1405 by Jacki Cones, RN Outcome: Progressing   Problem: Education: Goal: Knowledge of General Education information will improve Description: Including pain rating scale, medication(s)/side effects and non-pharmacologic comfort measures 05/21/2023 1405 by Jacki Cones, RN Outcome: Progressing 05/21/2023 1405 by Jacki Cones, RN Outcome: Progressing   Problem: Health Behavior/Discharge Planning: Goal: Ability to manage health-related needs will improve 05/21/2023 1405 by Jacki Cones, RN Outcome: Progressing 05/21/2023 1405 by Jacki Cones, RN Outcome: Progressing   Problem: Clinical Measurements: Goal: Ability to maintain clinical measurements within normal limits will improve 05/21/2023 1405 by Jacki Cones, RN Outcome: Progressing 05/21/2023 1405 by Jacki Cones, RN Outcome: Progressing Goal: Will remain free from infection 05/21/2023 1405 by Jacki Cones, RN Outcome: Progressing 05/21/2023 1405 by Jacki Cones, RN Outcome: Progressing Goal: Diagnostic test results will improve 05/21/2023 1405 by Jacki Cones, RN Outcome: Progressing 05/21/2023 1405 by Jacki Cones, RN Outcome: Progressing Goal: Respiratory  complications will improve 05/21/2023 1405 by Jacki Cones, RN Outcome: Progressing 05/21/2023 1405 by Jacki Cones, RN Outcome: Progressing Goal: Cardiovascular complication will be avoided 05/21/2023 1405 by Jacki Cones, RN Outcome: Progressing 05/21/2023 1405 by Jacki Cones, RN Outcome: Progressing   Problem: Activity: Goal: Risk for activity intolerance will decrease 05/21/2023 1405 by Jacki Cones, RN Outcome: Progressing  05/21/2023 1405 by Jacki Cones, RN Outcome: Progressing   Problem: Nutrition: Goal: Adequate nutrition will be maintained 05/21/2023 1405 by Jacki Cones, RN Outcome: Progressing 05/21/2023 1405 by Jacki Cones, RN Outcome: Progressing   Problem: Coping: Goal: Level of anxiety will decrease 05/21/2023 1405 by Jacki Cones, RN Outcome: Progressing 05/21/2023 1405 by Jacki Cones, RN Outcome: Progressing   Problem: Elimination: Goal: Will not experience complications related to bowel motility 05/21/2023 1405 by Jacki Cones, RN Outcome: Progressing 05/21/2023 1405 by Jacki Cones, RN Outcome: Progressing Goal: Will not experience complications related to urinary retention 05/21/2023 1405 by Jacki Cones, RN Outcome: Progressing 05/21/2023 1405 by Jacki Cones, RN Outcome: Progressing   Problem: Pain Managment: Goal: General experience of comfort will improve 05/21/2023 1405 by Jacki Cones, RN Outcome: Progressing 05/21/2023 1405 by Jacki Cones, RN Outcome: Progressing   Problem: Safety: Goal: Ability to remain free from injury will improve 05/21/2023 1405 by Jacki Cones, RN Outcome: Progressing 05/21/2023 1405 by Jacki Cones, RN Outcome: Progressing   Problem: Skin Integrity: Goal: Risk for impaired skin integrity will decrease 05/21/2023 1405 by Jacki Cones, RN Outcome: Progressing 05/21/2023 1405 by Jacki Cones, RN Outcome: Progressing

## 2023-05-21 NOTE — Plan of Care (Signed)

## 2023-05-21 NOTE — Progress Notes (Signed)
Patient's BP was 152/111, MD ,made aware of, PRN IV Hydralazine given.

## 2023-05-21 NOTE — Progress Notes (Signed)
Progress Note   Patient: Ana Herrera MWN:[id number] DOB: [birth year] DOA: 05/19/2023     1 DOS: the patient was seen and examined on 05/21/2023   Brief hospital course:  67 y.o. female with medical history significant for hypertension, chronic A-fib, cerebral aneurysm status post history of clip, restless leg syndrome, tobacco abuse brought by family paranoia confusion and delirious behavior for the past few days patient had a similar episode when she had gallbladder issues that was said to be because of the ammonia patient has been not eating cooperating with family members.  Patient is awake and alert to herself and where she is on arrival per nurses report.  Patient's symptoms of confusion have been progressively worsening over the past few months as well.  Patient did not recognize her family members today family members son and granddaughter at bedside states that her intermittent confusion has been going on for about 6 months or she is saying strange things and confabulating, significant weight loss of 150 pounds over the past few years.  Clinical presentation of failure to thrive and dementia plus worsening. In the emergency room initial EKG A-fib with no ST-T wave changes.  Head CT negative for any intracranial hemorrhage or infarct.  Lab work shows glucose 114 normal kidney function normal electrolytes normal ammonia level normal CBC.   8/16 : Patient symptomatically improved CT head pending.  Likely has underlying dementia as per son.  Will need appetite stimulant and dementia medication in outpatient setting.  8/17 : Patient had episode of tachycardia with a heart rate ranging between 120s to 130s.  Will start the patient on as needed metoprolol 5 mg IV for heart rate more than 120.  Has been normal in the morning.  Patient back to baseline.  Plan for discharge tomorrow if heart rate stays under 100  Assessment and Plan: * Confusion and disorientation : Patient likely has underlying  dementia now worsening.  CT head pending. UTI or any infectious   Fall/ aspiration precaution.   HTN (hypertension) : Continues to be hypertensive.  Cont metoprolol  Eliquis continued.   AKI (acute kidney injury)  Lab Results  Component Value Date   CREATININE 0.83 05/19/2023   CREATININE 0.99 12/02/2022   CREATININE 1.28 (H) 12/01/2022  Resolved we will follow and manage,.  Stroke  H/o stroke but no deficits at baseline.  Repeat ct head noncontast.   Atrial fibrillation, chronic ( Pt In chronic A.Fib and cont medications. EKG as needed.   Hypomagnesemia Ordered.   Hypokalemia Resolved. Will follow.   Tobacco abuse Nicotine patch.      Subjective: Patient is symptomatically feeling better.  No overnight events.  Physical Exam: Vitals:   05/20/23 2320 05/21/23 0310 05/21/23 0807 05/21/23 1231  BP: (!) 149/114 (!) 156/111 (!) 151/99 (!) 113/90  Pulse:  72 86 82  Resp: 20 20 18 18   Temp: 97.6 F (36.4 C) 97.6 F (36.4 C) 97.9 F (36.6 C)   TempSrc: Oral     SpO2: 94% 95% 96% 96%  Weight:       Physical Exam HENT:     Head: Normocephalic and atraumatic.     Nose: Nose normal.  Eyes:     Extraocular Movements: Extraocular movements intact.     Pupils: Pupils are equal, round, and reactive to light.  Cardiovascular:     Rate and Rhythm: Normal rate and regular rhythm.  Pulmonary:     Effort: Pulmonary effort is normal.     Breath  sounds: Normal breath sounds.  Abdominal:     General: There is no distension.  Musculoskeletal:        General: Normal range of motion.     Cervical back: Normal range of motion.  Skin:    General: Skin is warm.  Neurological:     General: No focal deficit present.     Mental Status: She is alert.  Psychiatric:        Mood and Affect: Mood normal.        Behavior: Behavior normal.     Data Reviewed:  There are no new results to review at this time.  Family Communication: Son by bedside updated about the  status  Disposition: Status is: Inpatient Remains inpatient appropriate because: AMS  Planned Discharge Destination: Home    Time spent: 31 minutes  Author: Kirstie Peri, MD 05/21/2023 1:58 PM  For on call review [email or web address].

## 2023-05-22 DIAGNOSIS — R410 Disorientation, unspecified: Secondary | ICD-10-CM | POA: Diagnosis not present

## 2023-05-22 LAB — COMPREHENSIVE METABOLIC PANEL WITH GFR
ALT: 8 U/L (ref 0–44)
AST: 19 U/L (ref 15–41)
Albumin: 4.2 g/dL (ref 3.5–5.0)
Alkaline Phosphatase: 104 U/L (ref 38–126)
Anion gap: 15 (ref 5–15)
BUN: 11 mg/dL (ref 8–23)
CO2: 19 mmol/L — ABNORMAL LOW (ref 22–32)
Calcium: 10.3 mg/dL (ref 8.9–10.3)
Chloride: 106 mmol/L (ref 98–111)
Creatinine, Ser: 0.72 mg/dL (ref 0.44–1.00)
GFR, Estimated: >60 mL/min
Glucose, Bld: 129 mg/dL — ABNORMAL HIGH (ref 70–99)
Potassium: 3.7 mmol/L (ref 3.5–5.1)
Sodium: 140 mmol/L (ref 135–145)
Total Bilirubin: 1.3 mg/dL — ABNORMAL HIGH (ref 0.3–1.2)
Total Protein: 8.2 g/dL — ABNORMAL HIGH (ref 6.5–8.1)

## 2023-05-22 LAB — CBC
HCT: 48 % — ABNORMAL HIGH (ref 36.0–46.0)
Hemoglobin: 15.9 g/dL — ABNORMAL HIGH (ref 12.0–15.0)
MCH: 29.2 pg (ref 26.0–34.0)
MCHC: 33.1 g/dL (ref 30.0–36.0)
MCV: 88.2 fL (ref 80.0–100.0)
Platelets: 421 10*3/uL — ABNORMAL HIGH (ref 150–400)
RBC: 5.44 MIL/uL — ABNORMAL HIGH (ref 3.87–5.11)
RDW: 14.9 % (ref 11.5–15.5)
WBC: 12.9 10*3/uL — ABNORMAL HIGH (ref 4.0–10.5)
nRBC: 0 % (ref 0.0–0.2)

## 2023-05-22 LAB — MAGNESIUM: Magnesium: 2.3 mg/dL (ref 1.7–2.4)

## 2023-05-22 MED ORDER — METOPROLOL TARTRATE 25 MG PO TABS
25.0000 mg | ORAL_TABLET | Freq: Two times a day (BID) | ORAL | Status: DC
  Administered 2023-05-22 – 2023-05-23 (×3): 25 mg via ORAL
  Filled 2023-05-22 (×3): qty 1

## 2023-05-22 MED ORDER — DONEPEZIL HCL 5 MG PO TABS
10.0000 mg | ORAL_TABLET | Freq: Every day | ORAL | Status: DC
  Administered 2023-05-22: 10 mg via ORAL
  Filled 2023-05-22: qty 2

## 2023-05-22 MED ORDER — HALOPERIDOL LACTATE 5 MG/ML IJ SOLN
2.0000 mg | Freq: Once | INTRAMUSCULAR | Status: AC
  Administered 2023-05-22: 2 mg via INTRAVENOUS
  Filled 2023-05-22: qty 1

## 2023-05-22 NOTE — Plan of Care (Signed)

## 2023-05-22 NOTE — Plan of Care (Signed)
Problem: Education: Goal: Knowledge of disease or condition will improve 05/22/2023 1918 by Suezanne Cheshire, RN Outcome: Progressing 05/22/2023 1500 by Suezanne Cheshire, RN Outcome: Progressing Goal: Knowledge of secondary prevention will improve (MUST DOCUMENT ALL) 05/22/2023 1918 by Suezanne Cheshire, RN Outcome: Progressing 05/22/2023 1500 by Suezanne Cheshire, RN Outcome: Progressing Goal: Knowledge of patient specific risk factors will improve Loraine Leriche N/A or DELETE if not current risk factor) 05/22/2023 1918 by Suezanne Cheshire, RN Outcome: Progressing 05/22/2023 1500 by Suezanne Cheshire, RN Outcome: Progressing   Problem: Ischemic Stroke/TIA Tissue Perfusion: Goal: Complications of ischemic stroke/TIA will be minimized 05/22/2023 1918 by Suezanne Cheshire, RN Outcome: Progressing 05/22/2023 1500 by Suezanne Cheshire, RN Outcome: Progressing   Problem: Coping: Goal: Will verbalize positive feelings about self 05/22/2023 1918 by Suezanne Cheshire, RN Outcome: Progressing 05/22/2023 1500 by Suezanne Cheshire, RN Outcome: Progressing Goal: Will identify appropriate support needs 05/22/2023 1918 by Suezanne Cheshire, RN Outcome: Progressing 05/22/2023 1500 by Suezanne Cheshire, RN Outcome: Progressing   Problem: Health Behavior/Discharge Planning: Goal: Ability to manage health-related needs will improve 05/22/2023 1918 by Suezanne Cheshire, RN Outcome: Progressing 05/22/2023 1500 by Suezanne Cheshire, RN Outcome: Progressing Goal: Goals will be collaboratively established with patient/family 05/22/2023 1918 by Suezanne Cheshire, RN Outcome: Progressing 05/22/2023 1500 by Suezanne Cheshire, RN Outcome: Progressing   Problem: Self-Care: Goal: Ability to participate in self-care as condition permits will improve 05/22/2023 1918 by Suezanne Cheshire, RN Outcome: Progressing 05/22/2023 1500 by Suezanne Cheshire, RN Outcome: Progressing Goal: Verbalization of  feelings and concerns over difficulty with self-care will improve 05/22/2023 1918 by Suezanne Cheshire, RN Outcome: Progressing 05/22/2023 1500 by Suezanne Cheshire, RN Outcome: Progressing Goal: Ability to communicate needs accurately will improve 05/22/2023 1918 by Suezanne Cheshire, RN Outcome: Progressing 05/22/2023 1500 by Suezanne Cheshire, RN Outcome: Progressing   Problem: Nutrition: Goal: Risk of aspiration will decrease 05/22/2023 1918 by Suezanne Cheshire, RN Outcome: Progressing 05/22/2023 1500 by Suezanne Cheshire, RN Outcome: Progressing Goal: Dietary intake will improve 05/22/2023 1918 by Suezanne Cheshire, RN Outcome: Progressing 05/22/2023 1500 by Suezanne Cheshire, RN Outcome: Progressing   Problem: Education: Goal: Knowledge of General Education information will improve Description: Including pain rating scale, medication(s)/side effects and non-pharmacologic comfort measures 05/22/2023 1918 by Suezanne Cheshire, RN Outcome: Progressing 05/22/2023 1500 by Suezanne Cheshire, RN Outcome: Progressing   Problem: Health Behavior/Discharge Planning: Goal: Ability to manage health-related needs will improve 05/22/2023 1918 by Suezanne Cheshire, RN Outcome: Progressing 05/22/2023 1500 by Suezanne Cheshire, RN Outcome: Progressing   Problem: Clinical Measurements: Goal: Ability to maintain clinical measurements within normal limits will improve 05/22/2023 1918 by Suezanne Cheshire, RN Outcome: Progressing 05/22/2023 1500 by Suezanne Cheshire, RN Outcome: Progressing Goal: Will remain free from infection 05/22/2023 1918 by Suezanne Cheshire, RN Outcome: Progressing 05/22/2023 1500 by Suezanne Cheshire, RN Outcome: Progressing Goal: Diagnostic test results will improve 05/22/2023 1918 by Suezanne Cheshire, RN Outcome: Progressing 05/22/2023 1500 by Suezanne Cheshire, RN Outcome: Progressing Goal: Respiratory complications will improve 05/22/2023 1918 by  Suezanne Cheshire, RN Outcome: Progressing 05/22/2023 1500 by Suezanne Cheshire, RN Outcome: Progressing Goal: Cardiovascular complication will be avoided 05/22/2023 1918 by Suezanne Cheshire, RN Outcome: Progressing 05/22/2023 1500 by Suezanne Cheshire, RN Outcome: Progressing   Problem: Activity: Goal: Risk for activity intolerance will decrease 05/22/2023 1918 by Suezanne Cheshire, RN Outcome: Progressing  05/22/2023 1500 by Suezanne Cheshire, RN Outcome: Progressing   Problem: Nutrition: Goal: Adequate nutrition will be maintained 05/22/2023 1918 by Suezanne Cheshire, RN Outcome: Progressing 05/22/2023 1500 by Suezanne Cheshire, RN Outcome: Progressing   Problem: Coping: Goal: Level of anxiety will decrease 05/22/2023 1918 by Suezanne Cheshire, RN Outcome: Progressing 05/22/2023 1500 by Suezanne Cheshire, RN Outcome: Progressing   Problem: Elimination: Goal: Will not experience complications related to bowel motility 05/22/2023 1918 by Suezanne Cheshire, RN Outcome: Progressing 05/22/2023 1500 by Suezanne Cheshire, RN Outcome: Progressing Goal: Will not experience complications related to urinary retention 05/22/2023 1918 by Suezanne Cheshire, RN Outcome: Progressing 05/22/2023 1500 by Suezanne Cheshire, RN Outcome: Progressing   Problem: Pain Managment: Goal: General experience of comfort will improve 05/22/2023 1918 by Suezanne Cheshire, RN Outcome: Progressing 05/22/2023 1500 by Suezanne Cheshire, RN Outcome: Progressing   Problem: Safety: Goal: Ability to remain free from injury will improve 05/22/2023 1918 by Suezanne Cheshire, RN Outcome: Progressing 05/22/2023 1500 by Suezanne Cheshire, RN Outcome: Progressing   Problem: Skin Integrity: Goal: Risk for impaired skin integrity will decrease 05/22/2023 1918 by Suezanne Cheshire, RN Outcome: Progressing 05/22/2023 1500 by Suezanne Cheshire, RN Outcome: Progressing

## 2023-05-22 NOTE — Progress Notes (Signed)
Progress Note   Patient: Ana Herrera:[id number] DOB: 1956-09-14 DOA: 05/19/2023     2 DOS: the patient was seen and examined on 05/22/2023   Brief hospital course:  67 y.o. female with medical history significant for hypertension, chronic A-fib, cerebral aneurysm status post history of clip, restless leg syndrome, tobacco abuse brought by family paranoia confusion and delirious behavior for the past few days patient had a similar episode when she had gallbladder issues that was said to be because of the ammonia patient has been not eating cooperating with family members.  Patient is awake and alert to herself and where she is on arrival per nurses report.  Patient's symptoms of confusion have been progressively worsening over the past few months as well.  Patient did not recognize her family members today family members son and granddaughter at bedside states that her intermittent confusion has been going on for about 6 months or she is saying strange things and confabulating, significant weight loss of 150 pounds over the past few years.  Clinical presentation of failure to thrive and dementia plus worsening. In the emergency room initial EKG A-fib with no ST-T wave changes.  Head CT negative for any intracranial hemorrhage or infarct.  Lab work shows glucose 114 normal kidney function normal electrolytes normal ammonia level normal CBC.   8/16 : Patient symptomatically improved CT head no acute finding .  Likely has underlying dementia as per son.  Will need appetite stimulant and dementia medication in outpatient setting.  8/17 : Patient had episode of tachycardia with a heart rate ranging between 120s to 130s.  Will start the patient on as needed metoprolol 5 mg IV for heart rate more than 120.  Has been normal in the morning.  Patient back to baseline.  Plan for discharge tomorrow if heart rate stays under 100  8/18 : Patient more confused today.  Was tachycardic/ Afib  started on standing  dose of metoprolol 25 mg twice daily.  Started  the patient on donepezil 10 mg daily.  Had elevated white count today but no fever.  Infectious etiology has been negative.  Assessment and Plan: * Confusion and disorientation : Patient likely has underlying dementia now worsening.  CT head no acute finding  UTI or any infectious   Fall/ aspiration precaution.   HTN (hypertension) : Continues to be hypertensive.  Cont metoprolol  Eliquis continued.   AKI (acute kidney injury)  Lab Results  Component Value Date   CREATININE 0.83 05/19/2023   CREATININE 0.99 12/02/2022   CREATININE 1.28 (H) 12/01/2022  Resolved we will follow and manage,.  Stroke  H/o stroke but no deficits at baseline.  Repeat ct head noncontast.   Atrial fibrillation, chronic continues in AFib Pt In chronic A.Fib and cont medications. EKG as needed.   Hypomagnesemia Ordered.   Hypokalemia Resolved. Will follow.   Tobacco abuse Nicotine patch.      Subjective: Patient is symptomatically feeling better.  No overnight events.   Physical Exam: Vitals:   05/22/23 0603 05/22/23 0817 05/22/23 1221 05/22/23 1521  BP:  93/73 127/86 126/87  Pulse: (!) 107 77 79 66  Resp:  19 17 18   Temp:   (!) 97.5 F (36.4 C)   TempSrc:      SpO2:  95% 98% 97%  Weight:       Physical Exam HENT:     Head: Normocephalic and atraumatic.     Nose: Nose normal.  Eyes:  Extraocular Movements: Extraocular movements intact.     Pupils: Pupils are equal, round, and reactive to light.  Cardiovascular:     Rate and Rhythm: Normal rate and regular rhythm.  Pulmonary:     Effort: Pulmonary effort is normal.     Breath sounds: Normal breath sounds.  Abdominal:     General: There is no distension.  Musculoskeletal:        General: Normal range of motion.     Cervical back: Normal range of motion.  Skin:    General: Skin is warm.  Neurological:     General: No focal deficit present.     Mental Status: She is alert.   Psychiatric:        Mood and Affect: Mood normal.        Behavior: Behavior normal.     Data Reviewed:  There are no new results to review at this time.  Family Communication: Son by bedside updated about the status  Disposition: Status is: Inpatient Remains inpatient appropriate because: AMS  Planned Discharge Destination: Home    Time spent: 31 minutes  Author: Kirstie Peri, MD 05/22/2023 4:12 PM  For on call review [email or web address].

## 2023-05-22 NOTE — Progress Notes (Signed)
Patient's heart rate was upto 180's while she was moving around and walking, MD made aware of, PO Metoprolol ordered and given.  Patient was too confused, trying to remove her tele box, walking around, as per night nurse she was doing the same, MD made aware of, IV Haloperidol once was ordered and given at 09:57.

## 2023-05-23 ENCOUNTER — Other Ambulatory Visit: Payer: Self-pay

## 2023-05-23 DIAGNOSIS — R410 Disorientation, unspecified: Secondary | ICD-10-CM | POA: Diagnosis not present

## 2023-05-23 DIAGNOSIS — I10 Essential (primary) hypertension: Secondary | ICD-10-CM

## 2023-05-23 DIAGNOSIS — E440 Moderate protein-calorie malnutrition: Secondary | ICD-10-CM | POA: Insufficient documentation

## 2023-05-23 DIAGNOSIS — E876 Hypokalemia: Secondary | ICD-10-CM | POA: Diagnosis not present

## 2023-05-23 DIAGNOSIS — Z720 Tobacco use: Secondary | ICD-10-CM

## 2023-05-23 DIAGNOSIS — N179 Acute kidney failure, unspecified: Secondary | ICD-10-CM

## 2023-05-23 DIAGNOSIS — I482 Chronic atrial fibrillation, unspecified: Secondary | ICD-10-CM

## 2023-05-23 LAB — COMPREHENSIVE METABOLIC PANEL
ALT: 9 U/L (ref 0–44)
AST: 15 U/L (ref 15–41)
Albumin: 3.4 g/dL — ABNORMAL LOW (ref 3.5–5.0)
Alkaline Phosphatase: 87 U/L (ref 38–126)
Anion gap: 10 (ref 5–15)
BUN: 13 mg/dL (ref 8–23)
CO2: 24 mmol/L (ref 22–32)
Calcium: 9.5 mg/dL (ref 8.9–10.3)
Chloride: 106 mmol/L (ref 98–111)
Creatinine, Ser: 0.55 mg/dL (ref 0.44–1.00)
GFR, Estimated: >60 mL/min (ref >60)
Glucose, Bld: 100 mg/dL — ABNORMAL HIGH (ref 70–99)
Potassium: 3.3 mmol/L — ABNORMAL LOW (ref 3.5–5.1)
Sodium: 140 mmol/L (ref 135–145)
Total Bilirubin: 1.2 mg/dL (ref 0.3–1.2)
Total Protein: 6.5 g/dL (ref 6.5–8.1)

## 2023-05-23 LAB — CBC
HCT: 39.4 % (ref 36.0–46.0)
Hemoglobin: 13 g/dL (ref 12.0–15.0)
MCH: 29.2 pg (ref 26.0–34.0)
MCHC: 33 g/dL (ref 30.0–36.0)
MCV: 88.5 fL (ref 80.0–100.0)
Platelets: 296 10*3/uL (ref 150–400)
RBC: 4.45 MIL/uL (ref 3.87–5.11)
RDW: 14.7 % (ref 11.5–15.5)
WBC: 7.9 10*3/uL (ref 4.0–10.5)
nRBC: 0 % (ref 0.0–0.2)

## 2023-05-23 MED ORDER — ENSURE ENLIVE PO LIQD
237.0000 mL | Freq: Three times a day (TID) | ORAL | 12 refills | Status: AC
  Filled 2023-05-23: qty 237, 1d supply, fill #0

## 2023-05-23 MED ORDER — APIXABAN 5 MG PO TABS
5.0000 mg | ORAL_TABLET | Freq: Two times a day (BID) | ORAL | 2 refills | Status: AC
  Filled 2023-05-23: qty 60, 30d supply, fill #0

## 2023-05-23 MED ORDER — DONEPEZIL HCL 10 MG PO TABS
10.0000 mg | ORAL_TABLET | Freq: Every day | ORAL | 0 refills | Status: DC
  Filled 2023-05-23: qty 30, 30d supply, fill #0

## 2023-05-23 MED ORDER — ADULT MULTIVITAMIN W/MINERALS CH
1.0000 | ORAL_TABLET | Freq: Every day | ORAL | 0 refills | Status: AC
  Filled 2023-05-23: qty 30, 30d supply, fill #0
  Filled 2023-05-23: qty 130, 130d supply, fill #0

## 2023-05-23 NOTE — TOC CM/SW Note (Signed)
Transition of Care [facility]) - Inpatient Brief Assessment   Patient Details  Name: Ana Herrera MRN: [id number] Date of Birth: May 24, 1956  [facility]) CM/SW Contact:    Margarito Liner, LCSW Phone Number: 05/23/2023, 11:54 AM   Clinical Narrative: Patient has orders to discharge home today. Chart reviewed. No TOC needs identified. PT and OT evaluated and recommended no follow up and no DME. CSW signing off.  Transition of Care Asessment: Insurance and Status: Insurance coverage has been reviewed Patient has primary care physician: Yes Home environment has been reviewed: Basement of son's home Prior level of function:: Independent Prior/Current Home Services: No current home services Social Determinants of Health Reivew: SDOH reviewed no interventions necessary Readmission risk has been reviewed: Yes Transition of care needs: no transition of care needs at this time

## 2023-05-23 NOTE — Plan of Care (Signed)
  Problem: Education: Goal: Knowledge of General Education information will improve Description: Including pain rating scale, medication(s)/side effects and non-pharmacologic comfort measures Outcome: Progressing   Problem: Activity: Goal: Risk for activity intolerance will decrease Outcome: Progressing   Problem: Elimination: Goal: Will not experience complications related to urinary retention Outcome: Progressing   Problem: Pain Managment: Goal: General experience of comfort will improve Outcome: Progressing   Problem: Safety: Goal: Ability to remain free from injury will improve Outcome: Progressing   Problem: Skin Integrity: Goal: Risk for impaired skin integrity will decrease Outcome: Progressing   

## 2023-05-23 NOTE — Discharge Summary (Signed)
Physician Discharge Summary   Patient: Ana Herrera MRN: [id number] DOB: 1956/04/17  Admit date:     05/19/2023  Discharge date: 05/23/23  Discharge Physician: Marcelino Duster   PCP: Dione Housekeeper, MD   Recommendations at discharge:    PCP follow up in 1 week. Suggested Neurology evaluation for dementia work up.  Discharge Diagnoses: Principal Problem:   Confusion and disorientation Active Problems:   HTN (hypertension)   AKI (acute kidney injury) (HCC)   Stroke (HCC)   Atrial fibrillation, chronic (HCC)   Hypokalemia   Hypomagnesemia   Tobacco abuse   AMS (altered mental status)   Malnutrition of moderate degree  Resolved Problems:   * No resolved hospital problems. *  Hospital Course: Ana Herrera is a 67 y.o. female with medical history significant for hypertension, chronic A-fib, cerebral aneurysm status post history of clip, restless leg syndrome, tobacco abuse brought by family paranoia confusion and delirious behavior for the past few days patient had a similar episode when she had gallbladder issues that was said to be because of the ammonia patient has been not eating cooperating with family members.  Patient is awake and alert to herself and where she is on arrival per nurses report.  Patient's symptoms of confusion have been progressively worsening over the past few months as well.  Patient did not recognize her family members today family members son and granddaughter at bedside states that her intermittent confusion has been going on for about 6 months or she is saying strange things and confabulating, significant weight loss of 150 pounds over the past few years.  Clinical presentation of failure to thrive and dementia plus worsening. In the emergency room initial EKG A-fib with no ST-T wave changes.  Head CT negative for any intracranial hemorrhage or infarct.  Lab work shows glucose 114 normal kidney function normal electrolytes normal ammonia  level normal CBC.  Patient is admitted to the hospitalist service for further management evaluation of confusion and disorientation.  She does not have any infectious etiology.  CT imaging unremarkable.  Her presentation likely worsening dementia, started on Aricept therapy advised PCP follow-up for neurology evaluation.  Patient's kidney function improved with gentle IV hydration.  Electrolytes closely monitored, repleted accordingly. BP high restarted on home meds. She is hemodynamically stable to be discharged home. Patient and son understand to follow up with PCP for further work up.         Consultants: None Procedures performed: none  Disposition: Home Diet recommendation:  Discharge Diet Orders (From admission, onward)     Start     Ordered   05/23/23 0000  Diet - low sodium heart healthy        05/23/23 1149           Cardiac diet DISCHARGE MEDICATION: Allergies as of 05/23/2023   No Known Allergies      Medication List     TAKE these medications    apixaban 5 MG Tabs tablet Commonly known as: ELIQUIS Take 1 tablet (5 mg total) by mouth 2 (two) times daily.   diltiazem 240 MG 24 hr capsule Commonly known as: CARDIZEM CD Take by mouth.   donepezil 10 MG tablet Commonly known as: ARICEPT Take 1 tablet (10 mg total) by mouth at bedtime.   feeding supplement Liqd Take 237 mLs by mouth 3 (three) times daily between meals.   lisinopril 20 MG tablet Commonly known as: ZESTRIL Hold until followup with primary care doctor due to low  blood pressure and acute kidney injury.   multivitamin with minerals Tabs tablet Take 1 tablet by mouth daily. Start taking on: May 24, 2023   nicotine 21 mg/24hr patch Commonly known as: NICODERM CQ - dosed in mg/24 hours Place 1 patch (21 mg total) onto the skin daily.        Discharge Exam: Filed Weights   05/20/23 1500  Weight: 55.8 kg   General - Elderly Caucasian female, no apparent distress HEENT - PERRLA,  EOMI, atraumatic head, non tender sinuses. Lung - Clear, rales, rhonchi, wheezes. Heart - S1, S2 heard, no murmurs, rubs, no pedal edema Neuro - Alert, awake and oriented x 3, non focal exam. Skin - Warm and dry.  Condition at discharge: stable  The results of significant diagnostics from this hospitalization (including imaging, microbiology, ancillary and laboratory) are listed below for reference.   Imaging Studies: ECHOCARDIOGRAM COMPLETE  Result Date: 05/21/2023    ECHOCARDIOGRAM REPORT   Patient Name:   Ana Herrera Date of Exam: 05/21/2023 Medical Rec #:  [id number]          Height:       67.0 in Accession #:    [id number]         Weight:       123.0 lb Date of Birth:  1955/11/24           BSA:          1.645 m Patient Age:    67 years           BP:           150/109 mmHg Patient Gender: F                  HR:           87 bpm. Exam Location:  [facility] Procedure: 2D Echo, Cardiac Doppler, Color Doppler and Strain Analysis Indications:     Stroke I63.9  History:         Patient has no prior history of Echocardiogram examinations.                  Stroke; Risk Factors:Hypertension.  Sonographer:     Neysa Bonito Roar Referring Phys:  [id number] Eliezer Mccoy PATEL Diagnosing Phys: Armanda Magic MD  Sonographer Comments: Global longitudinal strain was attempted. IMPRESSIONS  1. Left ventricular ejection fraction, by estimation, is 60 to 65%. The left ventricle has normal function. The left ventricle has no regional wall motion abnormalities. There is mild concentric left ventricular hypertrophy. Left ventricular diastolic function could not be evaluated.  2. Right ventricular systolic function is normal. The right ventricular size is normal. There is normal pulmonary artery systolic pressure. The estimated right ventricular systolic pressure is 25.5 mmHg.  3. Left atrial size was moderately dilated.  4. Right atrial size was mildly dilated.  5. The mitral valve is degenerative. Mild mitral valve regurgitation. No  evidence of mitral stenosis. Moderate mitral annular calcification.  6. The aortic valve is normal in structure. Aortic valve regurgitation is not visualized. No aortic stenosis is present.  7. The inferior vena cava is normal in size with greater than 50% respiratory variability, suggesting right atrial pressure of 3 mmHg. FINDINGS  Left Ventricle: Left ventricular ejection fraction, by estimation, is 60 to 65%. The left ventricle has normal function. The left ventricle has no regional wall motion abnormalities. The left ventricular internal cavity size was normal in size. There is  mild concentric left ventricular hypertrophy. Left  ventricular diastolic function could not be evaluated due to atrial fibrillation. Left ventricular diastolic function could not be evaluated. Right Ventricle: The right ventricular size is normal. No increase in right ventricular wall thickness. Right ventricular systolic function is normal. There is normal pulmonary artery systolic pressure. The tricuspid regurgitant velocity is 2.37 m/s, and  with an assumed right atrial pressure of 3 mmHg, the estimated right ventricular systolic pressure is 25.5 mmHg. Left Atrium: Left atrial size was moderately dilated. Right Atrium: Right atrial size was mildly dilated. Pericardium: There is no evidence of pericardial effusion. Mitral Valve: The mitral valve is degenerative in appearance. There is moderate thickening of the mitral valve leaflet(s). Moderate mitral annular calcification. Mild mitral valve regurgitation. No evidence of mitral valve stenosis. MV peak gradient, 4.8  mmHg. The mean mitral valve gradient is 1.0 mmHg. Tricuspid Valve: The tricuspid valve is normal in structure. Tricuspid valve regurgitation is mild . No evidence of tricuspid stenosis. Aortic Valve: The aortic valve is normal in structure. Aortic valve regurgitation is not visualized. No aortic stenosis is present. Aortic valve mean gradient measures 3.0 mmHg. Aortic  valve peak gradient measures 6.4 mmHg. Aortic valve area, by VTI measures 1.87 cm. Pulmonic Valve: The pulmonic valve was normal in structure. Pulmonic valve regurgitation is not visualized. No evidence of pulmonic stenosis. Aorta: The aortic root is normal in size and structure. Venous: The inferior vena cava is normal in size with greater than 50% respiratory variability, suggesting right atrial pressure of 3 mmHg. IAS/Shunts: No atrial level shunt detected by color flow Doppler.  LEFT VENTRICLE PLAX 2D LVIDd:         4.50 cm   Diastology LVIDs:         3.40 cm   LV e' medial:    9.36 cm/s LV PW:         1.10 cm   LV E/e' medial:  10.4 LV IVS:        1.20 cm   LV e' lateral:   11.20 cm/s LVOT diam:     1.80 cm   LV E/e' lateral: 8.7 LV SV:         37 LV SV Index:   22 LVOT Area:     2.54 cm  RIGHT VENTRICLE RV Basal diam:  3.80 cm RV Mid diam:    2.30 cm RV S prime:     14.70 cm/s TAPSE (M-mode): 2.1 cm LEFT ATRIUM             Index        RIGHT ATRIUM           Index LA diam:        4.30 cm 2.61 cm/m   RA Area:     19.10 cm LA Vol (A2C):   83.5 ml 50.76 ml/m  RA Volume:   52.50 ml  31.92 ml/m LA Vol (A4C):   65.2 ml 39.64 ml/m LA Biplane Vol: 74.4 ml 45.23 ml/m  AORTIC VALVE                    PULMONIC VALVE AV Area (Vmax):    1.72 cm     PV Vmax:        0.87 m/s AV Area (Vmean):   1.75 cm     PV Peak grad:   3.0 mmHg AV Area (VTI):     1.87 cm     RVOT Peak grad: 2 mmHg AV Vmax:  126.50 cm/s AV Vmean:          77.900 cm/s AV VTI:            0.196 m AV Peak Grad:      6.4 mmHg AV Mean Grad:      3.0 mmHg LVOT Vmax:         85.40 cm/s LVOT Vmean:        53.500 cm/s LVOT VTI:          0.144 m LVOT/AV VTI ratio: 0.73  AORTA Ao Root diam: 3.10 cm Ao Asc diam:  3.60 cm MITRAL VALVE               TRICUSPID VALVE MV Area (PHT): 3.33 cm    TR Peak grad:   22.5 mmHg MV Area VTI:   1.49 cm    TR Vmax:        237.00 cm/s MV Peak grad:  4.8 mmHg MV Mean grad:  1.0 mmHg    SHUNTS MV Vmax:       1.10 m/s     Systemic VTI:  0.14 m MV Vmean:      53.9 cm/s   Systemic Diam: 1.80 cm MV Decel Time: 228 msec MV E velocity: 97.50 cm/s Armanda Magic MD Electronically signed by Armanda Magic MD Signature Date/Time: 05/21/2023/5:27:06 PM    Final    CT HEAD WO CONTRAST  Result Date: 05/20/2023 CLINICAL DATA:  Transient ischemic attack (TIA) EXAM: CT HEAD WITHOUT CONTRAST TECHNIQUE: Contiguous axial images were obtained from the base of the skull through the vertex without intravenous contrast. RADIATION DOSE REDUCTION: This exam was performed according to the departmental dose-optimization program which includes automated exposure control, adjustment of the mA and/or kV according to patient size and/or use of iterative reconstruction technique. COMPARISON:  CT head 05/19/2023. FINDINGS: Brain: No evidence of acute large vascular territory infarct, acute hemorrhage, mass lesion, midline shift or hydrocephalus. Similar chronic microvascular ischemic changes including small remote lacunar infarcts. Vascular: Right sided aneurysm clip. Skull: No acute fracture. Sinuses/Orbits: Near complete opacification of multiple sinuses. No acute orbital findings. Other: No mastoid effusions. IMPRESSION: 1. Stable head CT.  No evidence of acute intracranial abnormality. 2. Chronic microvascular ischemic disease. 3. Paranasal sinus disease. Electronically Signed   By: Feliberto Harts M.D.   On: 05/20/2023 17:38   CT Head Wo Contrast  Result Date: 05/19/2023 CLINICAL DATA:  Altered mental status. EXAM: CT HEAD WITHOUT CONTRAST TECHNIQUE: Contiguous axial images were obtained from the base of the skull through the vertex without intravenous contrast. RADIATION DOSE REDUCTION: This exam was performed according to the departmental dose-optimization program which includes automated exposure control, adjustment of the mA and/or kV according to patient size and/or use of iterative reconstruction technique. COMPARISON:  Head CT dated 11/28/2022.  FINDINGS: Brain: Mild age-related atrophy and moderate chronic microvascular ischemic changes. There is no acute intracranial hemorrhage. No mass effect or midline shift no extra-axial fluid collection. Vascular: Right sided aneurysm clip. Skull: No acute osseous pathology.  Right temporal craniotomy. Sinuses/Orbits: Extensive opacification of the paranasal sinuses new since the prior CTs with air-fluid levels in the maxillary sinuses. The mastoid air cells are clear. Other: None IMPRESSION: 1. No acute intracranial pathology. 2. Mild age-related atrophy and moderate chronic microvascular ischemic changes. 3. Extensive paranasal sinus disease. Electronically Signed   By: Elgie Collard M.D.   On: 05/19/2023 17:30    Microbiology: Results for orders placed or performed during the hospital encounter of 05/19/23  SARS Coronavirus 2 by RT PCR (hospital order, performed in [facility] hospital lab) *cepheid single result test* Anterior Nasal Swab     Status: None   Collection Time: 05/19/23  4:14 PM   Specimen: Anterior Nasal Swab  Result Value Ref Range Status   SARS Coronavirus 2 by RT PCR NEGATIVE NEGATIVE Final    Comment: (NOTE) SARS-CoV-2 target nucleic acids are NOT DETECTED.  The SARS-CoV-2 RNA is generally detectable in upper and lower respiratory specimens during the acute phase of infection. The lowest concentration of SARS-CoV-2 viral copies this assay can detect is 250 copies / mL. A negative result does not preclude SARS-CoV-2 infection and should not be used as the sole basis for treatment or other patient management decisions.  A negative result may occur with improper specimen collection / handling, submission of specimen other than nasopharyngeal swab, presence of viral mutation(s) within the areas targeted by this assay, and inadequate number of viral copies (<250 copies / mL). A negative result must be combined with clinical observations, patient history, and epidemiological  information.  Fact Sheet for Patients:   RoadLapTop.co.za  Fact Sheet for Healthcare Providers: http://kim-miller.com/  This test is not yet approved or  cleared by the [place] FDA and has been authorized for detection and/or diagnosis of SARS-CoV-2 by FDA under an Emergency Use Authorization (EUA).  This EUA will remain in effect (meaning this test can be used) for the duration of the COVID-19 declaration under Section 564(b)(1) of the Act, 21 U.S.C. section 360bbb-3(b)(1), unless the authorization is terminated or revoked sooner.  Performed at [facility], [address] [place], [place] [address]   Culture, blood (Routine X 2) w Reflex to ID Panel     Status: None (Preliminary result)   Collection Time: 05/19/23  9:18 PM   Specimen: BLOOD  Result Value Ref Range Status   Specimen Description BLOOD BLOOD LEFT ARM  Final   Special Requests   Final    BOTTLES DRAWN AEROBIC AND ANAEROBIC Blood Culture adequate volume   Culture   Final    NO GROWTH 4 DAYS Performed at [facility], [address] [place], [place] [address]    Report Status PENDING  Incomplete  Culture, blood (Routine X 2) w Reflex to ID Panel     Status: None (Preliminary result)   Collection Time: 05/19/23  9:28 PM   Specimen: BLOOD  Result Value Ref Range Status   Specimen Description BLOOD BLOOD RIGHT HAND  Final   Special Requests   Final    BOTTLES DRAWN AEROBIC AND ANAEROBIC Blood Culture results may not be optimal due to an inadequate volume of blood received in culture bottles   Culture   Final    NO GROWTH 4 DAYS Performed at [facility], [address], [place], [place] [address]    Report Status PENDING  Incomplete  Urine Culture (for pregnant, neutropenic or urologic patients or patients with an indwelling urinary catheter)     Status: Abnormal   Collection Time: 05/19/23 10:21 PM   Specimen: Urine, Clean Catch   Result Value Ref Range Status   Specimen Description   Final    URINE, CLEAN CATCH Performed at [facility], [address], [place], [place] [address]    Special Requests   Final    NONE Performed at [facility], [address], [place], [place] [address]    Culture (A)  Final    <10,000 COLONIES/mL INSIGNIFICANT  GROWTH Performed at [facility], [address], [place], [place] [address]    Report Status 05/21/2023 FINAL  Final    Labs: CBC: Recent Labs  Lab 05/19/23 1300 05/22/23 0506 05/23/23 0430  WBC 8.3 12.9* 7.9  HGB 14.4 15.9* 13.0  HCT 45.0 48.0* 39.4  MCV 92.6 88.2 88.5  PLT 335 421* 296   Basic Metabolic Panel: Recent Labs  Lab 05/19/23 1300 05/22/23 0504 05/22/23 0506 05/23/23 0430  NA 137  --  140 140  K 3.6  --  3.7 3.3*  CL 104  --  106 106  CO2 24  --  19* 24  GLUCOSE 114*  --  129* 100*  BUN 8  --  11 13  CREATININE 0.83  --  0.72 0.55  CALCIUM 9.5  --  10.3 9.5  MG  --  2.3  --   --    Liver Function Tests: Recent Labs  Lab 05/19/23 1300 05/22/23 0506 05/23/23 0430  AST 13* 19 15  ALT 10 8 9   ALKPHOS 96 104 87  BILITOT 0.9 1.3* 1.2  PROT 7.2 8.2* 6.5  ALBUMIN 3.8 4.2 3.4*   CBG: No results for input(s): "GLUCAP" in the last 168 hours.  Discharge time spent: greater than 30 minutes.  Signed: Marcelino Duster, MD [facility] 05/23/2023

## 2023-05-23 NOTE — Progress Notes (Signed)
Reviewed written and verbal discharge instructions with patient. Patient acknowledged understanding and states she will comply. Patient discharged with personal belongings. Staff wheeled patient out. Patient being transported home vis family vehicle. No distress noted when patient wheeled to car.

## 2023-05-24 LAB — CULTURE, BLOOD (ROUTINE X 2)
Culture: NO GROWTH
Culture: NO GROWTH
Special Requests: ADEQUATE

## 2024-02-29 ENCOUNTER — Emergency Department
Admission: EM | Admit: 2024-02-29 | Discharge: 2024-02-29 | Disposition: A | Discharge status: Home / Self Care | Attending: Emergency Medicine | Admitting: Emergency Medicine

## 2024-02-29 ENCOUNTER — Other Ambulatory Visit: Payer: Self-pay

## 2024-02-29 ENCOUNTER — Emergency Department

## 2024-02-29 DIAGNOSIS — Z7901 Long term (current) use of anticoagulants: Secondary | ICD-10-CM | POA: Diagnosis not present

## 2024-02-29 DIAGNOSIS — R410 Disorientation, unspecified: Secondary | ICD-10-CM

## 2024-02-29 DIAGNOSIS — I10 Essential (primary) hypertension: Secondary | ICD-10-CM | POA: Diagnosis not present

## 2024-02-29 DIAGNOSIS — F0390 Unspecified dementia without behavioral disturbance: Secondary | ICD-10-CM | POA: Insufficient documentation

## 2024-02-29 DIAGNOSIS — N3000 Acute cystitis without hematuria: Secondary | ICD-10-CM

## 2024-02-29 LAB — COMPREHENSIVE METABOLIC PANEL WITH GFR
ALT: 14 U/L (ref 0–44)
AST: 16 U/L (ref 15–41)
Albumin: 4 g/dL (ref 3.5–5.0)
Alkaline Phosphatase: 105 U/L (ref 38–126)
Anion gap: 9 (ref 5–15)
BUN: 8 mg/dL (ref 8–23)
CO2: 28 mmol/L (ref 22–32)
Calcium: 9.9 mg/dL (ref 8.9–10.3)
Chloride: 101 mmol/L (ref 98–111)
Creatinine, Ser: 0.54 mg/dL (ref 0.44–1.00)
GFR, Estimated: >60 mL/min (ref >60)
Glucose, Bld: 103 mg/dL — ABNORMAL HIGH (ref 70–99)
Potassium: 3.5 mmol/L (ref 3.5–5.1)
Sodium: 138 mmol/L (ref 135–145)
Total Bilirubin: 0.9 mg/dL (ref 0.0–1.2)
Total Protein: 7.7 g/dL (ref 6.5–8.1)

## 2024-02-29 LAB — CBC
HCT: 45.5 % (ref 36.0–46.0)
Hemoglobin: 14.6 g/dL (ref 12.0–15.0)
MCH: 28.6 pg (ref 26.0–34.0)
MCHC: 32.1 g/dL (ref 30.0–36.0)
MCV: 89.2 fL (ref 80.0–100.0)
Platelets: 334 10*3/uL (ref 150–400)
RBC: 5.1 MIL/uL (ref 3.87–5.11)
RDW: 12.8 % (ref 11.5–15.5)
WBC: 9.1 10*3/uL (ref 4.0–10.5)
nRBC: 0 % (ref 0.0–0.2)

## 2024-02-29 LAB — URINALYSIS, ROUTINE W REFLEX MICROSCOPIC
Bacteria, UA: NONE SEEN
Bilirubin Urine: NEGATIVE
Glucose, UA: NEGATIVE mg/dL
Hgb urine dipstick: NEGATIVE
Ketones, ur: NEGATIVE mg/dL
Nitrite: NEGATIVE
Protein, ur: NEGATIVE mg/dL
Specific Gravity, Urine: 1.04 — ABNORMAL HIGH (ref 1.005–1.030)
pH: 8 (ref 5.0–8.0)

## 2024-02-29 MED ORDER — SODIUM CHLORIDE 0.9 % IV SOLN
1.0000 g | Freq: Once | INTRAVENOUS | Status: AC
  Administered 2024-02-29: 1 g via INTRAVENOUS
  Filled 2024-02-29: qty 10

## 2024-02-29 MED ORDER — CEPHALEXIN 500 MG PO CAPS: 500.0000 mg | ORAL_CAPSULE | Freq: Two times a day (BID) | ORAL | 0 refills | Status: AC

## 2024-02-29 MED ORDER — IOHEXOL 300 MG/ML  SOLN
100.0000 mL | Freq: Once | INTRAMUSCULAR | Status: AC | PRN
  Administered 2024-02-29: 100 mL via INTRAVENOUS
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
## 2024-02-29 NOTE — ED Notes (Signed)
 Pt ambulatory to bathroom at this time.

## 2024-02-29 NOTE — Discharge Instructions (Signed)
 Your testing today was overall reassuring.  Your urine was somewhat concerning for an infection.  With your increased confusion, we will go ahead and treat you.  Follow-up closely with your primary care doctor for further evaluation.  Return to the ER for any new or worsening symptoms.

## 2024-02-29 NOTE — ED Provider Notes (Signed)
 [facility] Provider Note    Event Date/Time   First MD Initiated Contact with Patient 02/29/24 [phone]     (approximate)   History   No chief complaint on file.   HPI  Ana Herrera is a 68 year old female with history of HTN, A-fib on Eliquis , cerebral aneurysm, dementia presenting to the emergency department for evaluation of confusion.  Companied by son who provides collateral history.  He reports that the patient has mild confusion at baseline, but over the past week has had increased confusion.  For example, patient tried to start a washer and dryer with no clothes in it.  Son reports that the patient had similar change in mental status in the setting of infection in the past.  Has been holding her abdomen though she is not overtly complained of pain.  Reviewed discharge summary from 05/23/2023.  At that time, worsening confusion thought to be related to progressive dementia.  Also reviewed discharge summary from 12/02/2022.  At that time patient was found to have choledocholithiasis for which she underwent ERCP with biliary stent placement.        Physical Exam   Triage Vital Signs: ED Triage Vitals  Encounter Vitals Group     BP 02/29/24 0716 (!) 163/102     Systolic BP Percentile --      Diastolic BP Percentile --      Pulse Rate 02/29/24 0716 60     Resp 02/29/24 0716 20     Temp 02/29/24 0716 98.4 F (36.9 C)     Temp Source 02/29/24 0716 Oral     SpO2 02/29/24 0716 98 %     Weight --      Height --      Head Circumference --      Peak Flow --      Pain Score 02/29/24 0717 0     Pain Loc --      Pain Education --      Exclude from Growth Chart --     Most recent vital signs: Vitals:   02/29/24 1000 02/29/24 1030  BP: (!) 144/96 (!) 147/115  Pulse: (!) 52 65  Resp: 13 16  Temp:    SpO2: 100% 100%     General: Awake, interactive  CV:  Regular rate, good peripheral perfusion.  Resp:  Unlabored respirations, lungs clear to  auscultation Abd:  Nondistended, soft, nontender Neuro:  Symmetric facial movement, fluid speech oriented to self, place, month, thinks the year is 2005 but is able to tell me that Trump is the president, I have advised of the bilateral upper lower extremities.   ED Results / Procedures / Treatments   Labs (all labs ordered are listed, but only abnormal results are displayed) Labs Reviewed  COMPREHENSIVE METABOLIC PANEL WITH GFR - Abnormal; Notable for the following components:      Result Value   Glucose, Bld 103 (*)    All other components within normal limits  URINALYSIS, ROUTINE W REFLEX MICROSCOPIC - Abnormal; Notable for the following components:   Color, Urine STRAW (*)    APPearance CLEAR (*)    Specific Gravity, Urine 1.040 (*)    Leukocytes,Ua SMALL (*)    All other components within normal limits  URINE CULTURE  CBC  CBG MONITORING, ED     EKG EKG independently reviewed interpreted by myself (ER attending) demonstrates:  EKG demonstrates A-fib at a rate of 74, QRS 92, QTc 505, no acute ST changes  RADIOLOGY Imaging independently reviewed and interpreted by myself demonstrates:  CXR without focal consolidation CT head without acute bleed  CT abdomen pelvis with small area of residual fluid near the gallbladder, no acute inflammatory findings note  Formal Radiology Read:  CT ABDOMEN PELVIS W CONTRAST Result Date: 02/29/2024 CLINICAL DATA:  Dementia, increasing confusion, abdominal pain EXAM: CT ABDOMEN AND PELVIS WITH CONTRAST TECHNIQUE: Multidetector CT imaging of the abdomen and pelvis was performed using the standard protocol following bolus administration of intravenous contrast. RADIATION DOSE REDUCTION: This exam was performed according to the departmental dose-optimization program which includes automated exposure control, adjustment of the mA and/or kV according to patient size and/or use of iterative reconstruction technique. CONTRAST:  OMNIPAQUE  IOHEXOL   300 MG/ML  SOLN COMPARISON:  01/25/2023, 12/01/2022 FINDINGS: Lower chest: No acute pleural or parenchymal lung disease. Hepatobiliary: Gallbladder is decompressed, with no evidence of cholelithiasis or cholecystitis. There is a 2.5 x 1.7 cm hypodense area interposed between the gallbladder fundus and the right lobe liver, likely residual of the previous complex cystic fluid collection seen on the 12/01/2022 exam. No pericholecystic fat stranding. The liver is unremarkable. No biliary duct dilation. Pancreas: Unremarkable. No pancreatic ductal dilatation or surrounding inflammatory changes. Spleen: Normal in size without focal abnormality. Adrenals/Urinary Tract: The kidneys enhance normally and symmetrically. No urinary tract calculi or obstructive uropathy. The adrenals and bladder are unremarkable. Stomach/Bowel: No bowel obstruction or ileus. Normal appendix right lower quadrant. No bowel wall thickening or inflammatory change. Vascular/Lymphatic: Aortic atherosclerosis. No enlarged abdominal or pelvic lymph nodes. Reproductive: Uterus and bilateral adnexa are unremarkable. Other: No free fluid or free intraperitoneal gas. No abdominal wall hernia. Musculoskeletal: No acute or destructive bony abnormalities. Reconstructed images demonstrate no additional findings. IMPRESSION: 1. Residual 2.5 cm hypodense area between the gallbladder fundus and the liver, likely residual of the previous complex fluid collection identified on the 12/01/2022 exam. This could reflect small chronic abscess, with no evidence of acute inflammation. If further evaluation is desired, right upper quadrant ultrasound could be considered. 2. Otherwise no acute intra-abdominal or intrapelvic process. 3.  Aortic Atherosclerosis (ICD10-I70.0). Electronically Signed   By: Bobbye Burrow M.D.   On: 02/29/2024 08:39   CT Head Wo Contrast Result Date: 02/29/2024 CLINICAL DATA:  Dementia, increasing confusion, erratically behavior EXAM: CT HEAD  WITHOUT CONTRAST TECHNIQUE: Contiguous axial images were obtained from the base of the skull through the vertex without intravenous contrast. RADIATION DOSE REDUCTION: This exam was performed according to the departmental dose-optimization program which includes automated exposure control, adjustment of the mA and/or kV according to patient size and/or use of iterative reconstruction technique. COMPARISON:  05/20/2023 FINDINGS: Brain: Chronic small vessel ischemic changes are again seen throughout the periventricular white matter and right basal ganglia, stable. No evidence of acute infarct or hemorrhage. Lateral ventricles and remaining midline structures are unremarkable. No acute extra-axial fluid collections. No mass effect. Vascular: Aneurysm clip at the right ICA terminus again noted. No hyperdense vessel. Skull: Prior right frontal craniotomy. No acute or destructive bony abnormalities. Sinuses/Orbits: No acute finding. Other: None. IMPRESSION: 1. Stable head CT, no acute intracranial process. Electronically Signed   By: Bobbye Burrow M.D.   On: 02/29/2024 08:34   DG Chest Port 1 View Result Date: 02/29/2024 CLINICAL DATA:  Confusion. EXAM: PORTABLE CHEST 1 VIEW COMPARISON:  02/16/2011 FINDINGS: The lungs are clear without focal pneumonia, edema, pneumothorax or pleural effusion. Cardiopericardial silhouette is at upper limits of normal for size. No acute bony abnormality. Telemetry  leads overlie the chest. IMPRESSION: No active disease. Electronically Signed   By: Donnal Fusi M.D.   On: 02/29/2024 08:29    PROCEDURES:  Critical Care performed: No  Procedures   MEDICATIONS ORDERED IN ED: Medications  iohexol  (OMNIPAQUE ) 300 MG/ML solution 100 mL (100 mLs Intravenous Contrast Given 02/29/24 0813)  cefTRIAXone  (ROCEPHIN ) 1 g in sodium chloride  0.9 % 100 mL IVPB (1 g Intravenous New Bag/Given 02/29/24 1050)     IMPRESSION / MDM / ASSESSMENT AND PLAN / ED COURSE  I reviewed the triage vital  signs and the nursing notes.  Differential diagnosis includes, but is not limited to, UTI, pneumonia, intra-abdominal pathology, other infection, anemia, electrolyte abnormality, progressive dementia, intracranial bleed  Patient's presentation is most consistent with acute presentation with potential threat to life or bodily function.  68 year old female presenting with worsening confusion in the setting of baseline dementia.  Stable vitals on presentation.  Will obtain labs, urine, CT head, chest x-Samariya Rockhold.  With history of intra-abdominal infection we will also obtain CT abdomen pelvis as well.  1121 Reassuring CBC, CMP.  Reassuring CT head and chest x-Keymora Grillot.  CT abdomen pelvis with mild area of residual fluid.  Patient without focal tenderness in the area, otherwise without findings of acute infection.  Suspect that this is likely chronic and less likely contributing to her acute presentation.  Urinalysis is somewhat abnormal with 6-10 white blood cells and small leukocyte Estrace though there are some squamous cells present.  Family does report similar mental status changes with a UTI in the past, so do think empiric treatment is reasonable.  Urine culture sent.  Patient was given a dose of Rocephin  here.  Will DC on Keflex.  Strict return precautions provided.  Patient discharged in stable condition.    FINAL CLINICAL IMPRESSION(S) / ED DIAGNOSES   Final diagnoses:  Confusion  Acute cystitis without hematuria     Rx / DC Orders   ED Discharge Orders          Ordered    cephALEXin (KEFLEX) 500 MG capsule  2 times daily        02/29/24 1125             Note:  This document was prepared using Dragon voice recognition software and may include unintentional dictation errors.   Claria Crofts, MD 02/29/24 [phone]

## 2024-03-01 LAB — URINE CULTURE: Culture: NO GROWTH

## 2024-03-27 ENCOUNTER — Other Ambulatory Visit: Payer: Self-pay

## 2024-03-27 ENCOUNTER — Emergency Department
Admission: EM | Admit: 2024-03-27 | Discharge: 2024-03-28 | Disposition: A | Discharge status: Home / Self Care | Attending: Emergency Medicine | Admitting: Emergency Medicine

## 2024-03-27 ENCOUNTER — Emergency Department

## 2024-03-27 DIAGNOSIS — F03918 Unspecified dementia, unspecified severity, with other behavioral disturbance: Secondary | ICD-10-CM | POA: Diagnosis not present

## 2024-03-27 DIAGNOSIS — E860 Dehydration: Secondary | ICD-10-CM | POA: Insufficient documentation

## 2024-03-27 DIAGNOSIS — Z7901 Long term (current) use of anticoagulants: Secondary | ICD-10-CM | POA: Diagnosis not present

## 2024-03-27 DIAGNOSIS — R4182 Altered mental status, unspecified: Secondary | ICD-10-CM | POA: Diagnosis present

## 2024-03-27 HISTORY — DX: Unspecified dementia, unspecified severity, without behavioral disturbance, psychotic disturbance, mood disturbance, and anxiety: F03.90

## 2024-03-27 HISTORY — DX: Unspecified atrial fibrillation: I48.91

## 2024-03-27 LAB — CBC
HCT: 42.7 % (ref 36.0–46.0)
Hemoglobin: 14 g/dL (ref 12.0–15.0)
MCH: 29 pg (ref 26.0–34.0)
MCHC: 32.8 g/dL (ref 30.0–36.0)
MCV: 88.4 fL (ref 80.0–100.0)
Platelets: 314 10*3/uL (ref 150–400)
RBC: 4.83 MIL/uL (ref 3.87–5.11)
RDW: 13.2 % (ref 11.5–15.5)
WBC: 9.6 10*3/uL (ref 4.0–10.5)
nRBC: 0 % (ref 0.0–0.2)

## 2024-03-27 LAB — COMPREHENSIVE METABOLIC PANEL WITH GFR
ALT: 12 U/L (ref 0–44)
AST: 32 U/L (ref 15–41)
Albumin: 3.8 g/dL (ref 3.5–5.0)
Alkaline Phosphatase: 115 U/L (ref 38–126)
Anion gap: 12 (ref 5–15)
BUN: 11 mg/dL (ref 8–23)
CO2: 22 mmol/L (ref 22–32)
Calcium: 9.7 mg/dL (ref 8.9–10.3)
Chloride: 102 mmol/L (ref 98–111)
Creatinine, Ser: 0.82 mg/dL (ref 0.44–1.00)
GFR, Estimated: >60 mL/min (ref >60)
Glucose, Bld: 143 mg/dL — ABNORMAL HIGH (ref 70–99)
Potassium: 3.3 mmol/L — ABNORMAL LOW (ref 3.5–5.1)
Sodium: 136 mmol/L (ref 135–145)
Total Bilirubin: 1.1 mg/dL (ref 0.0–1.2)
Total Protein: 7.3 g/dL (ref 6.5–8.1)

## 2024-03-27 NOTE — ED Triage Notes (Signed)
 To ED with son for AMS--hx dementia, more confused than usual last several days. Treated for UTI about 1 month ago. Pt alert and cooperative in triage.

## 2024-03-27 NOTE — ED Provider Notes (Signed)
 [facility] Provider Note    Event Date/Time   First MD Initiated Contact with Patient 03/27/24 2259     (approximate)   History   Altered Mental Status   HPI POETRY CERRO is a 68 y.o. female with history of dementia, A-fib on Eliquis  presenting today for altered mental status.  Patient presents with son who states over the last several days she has had worsening altered mental status.  Does have a history of dementia with mild confusion but feels in the last 24 to 72 hours she has become more confused not remembering things she typically would as well as difficulty understanding her speech.  No new symptoms in the last 24 hours.  Has not noted any obvious weakness or numbness sensation.  She has not complained of any pain anywhere.  Reports she had a UTI in the past that presented similar to this.  No cough or congestion or fevers.     Physical Exam   Triage Vital Signs: ED Triage Vitals [03/27/24 1858]  Encounter Vitals Group     BP (!) 152/96     Girls Systolic BP Percentile      Girls Diastolic BP Percentile      Boys Systolic BP Percentile      Boys Diastolic BP Percentile      Pulse Rate 71     Resp 16     Temp 98 F (36.7 C)     Temp Source Oral     SpO2 100 %     Weight 150 lb (68 kg)     Height 5' 6 (1.676 m)     Head Circumference      Peak Flow      Pain Score      Pain Loc      Pain Education      Exclude from Growth Chart     Most recent vital signs: Vitals:   03/28/24 0038 03/28/24 0423  BP: (!) 176/108 (!) 158/99  Pulse: 66 60  Resp: 16 16  Temp: 97.6 F (36.4 C) 98 F (36.7 C)  SpO2: 96% 96%   Physical Exam: I have reviewed the vital signs and nursing notes. General: Awake, alert, no acute distress.  Nontoxic appearing. Head:  Atraumatic, normocephalic.   ENT:  EOM intact, PERRL. Oral mucosa is pink and moist with no lesions. Neck: Neck is supple with full range of motion, No meningeal signs. Cardiovascular:   RRR, No murmurs. Peripheral pulses palpable and equal bilaterally. Respiratory:  Symmetrical chest wall expansion.  No rhonchi, rales, or wheezes.  Good air movement throughout.  No use of accessory muscles.   Musculoskeletal:  No cyanosis or edema. Moving extremities with full ROM Abdomen:  Soft, nontender, nondistended. Neuro:  GCS 14 for confusion, moving all four extremities, interacting appropriately.  Speech appears slightly garbled occasionally with confusion.  Cranial nerves II through XII otherwise intact.  No obvious strength deficits noted anywhere and no sensation deficits. Psych:  Calm, appropriate.   Skin:  Warm, dry, no rash.    ED Results / Procedures / Treatments   Labs (all labs ordered are listed, but only abnormal results are displayed) Labs Reviewed  COMPREHENSIVE METABOLIC PANEL WITH GFR - Abnormal; Notable for the following components:      Result Value   Potassium 3.3 (*)    Glucose, Bld 143 (*)    All other components within normal limits  URINALYSIS, ROUTINE W REFLEX MICROSCOPIC - Abnormal; Notable  for the following components:   Color, Urine YELLOW (*)    APPearance CLEAR (*)    Specific Gravity, Urine >1.046 (*)    All other components within normal limits  CBC  CBG MONITORING, ED     EKG My EKG interpretation: Rate of 104, A-fib.  Normal axis, normal intervals.  No acute ST elevations or depressions   RADIOLOGY Independently interpreted CTA head and neck with no obvious acute pathology   PROCEDURES:  Critical Care performed: No  Procedures   MEDICATIONS ORDERED IN ED: Medications  iohexol  (OMNIPAQUE ) 350 MG/ML injection 75 mL (75 mLs Intravenous Contrast Given 03/28/24 0006)  lactated ringers  bolus 1,000 mL (0 mLs Intravenous Stopped 03/28/24 0414)     IMPRESSION / MDM / ASSESSMENT AND PLAN / ED COURSE  I reviewed the triage vital signs and the nursing notes.                              Differential diagnosis includes, but is not  limited to, UTI, dehydration, electrolyte abnormality, aneurysm, lower suspicion CVA  Patient's presentation is most consistent with acute complicated illness / injury requiring diagnostic workup.  Patient is a 68 year old female presenting today for altered mental status over the past 24 to 72 hours with confusion.  Vital signs stable on arrival and physical exam largely unremarkable outside of confusion.  CBC and CMP only notable for mild hypokalemia.  Will obtain UA as well to evaluate for infection given symptoms in the past resulting from a UTI.  Separately, she does have prior history of CVA and aneurysms will get CTA head and neck to rule out any acute pathology.  She has clips from prior aneurysms and cannot undergo an MRI.  CTA head and neck shows no acute pathology.  Patient given 1 L fluid as she had difficult time producing urine.  Eventually required In-N-Out cath.  UA shows no signs of infection but does have high specific gravity possibly indicating some dehydration playing into this.  She was reassessed with no significant difference from her baseline.  Did discuss with son at the bedside about additional workup either in the hospital or outpatient with the PCP.  They prefer to go home at this time as they believe it is more likely dementia and may be dehydration contributing to her symptoms.  I do not see any life-threatening pathology and patient is safe for discharge at this time and follow-up with PCP.  Given strict return precautions.  The patient is on the cardiac monitor to evaluate for evidence of arrhythmia and/or significant heart rate changes. Clinical Course as of 03/28/24 0437  Wed Mar 28, 2024  0236 Still unable to produce any urine.  Will give 1 L of fluids for assistance. [DW]    Clinical Course User Index [DW] Malvina Alm DASEN, MD     FINAL CLINICAL IMPRESSION(S) / ED DIAGNOSES   Final diagnoses:  Dehydration  Dementia with other behavioral disturbance, unspecified  dementia severity, unspecified dementia type (HCC)     Rx / DC Orders   ED Discharge Orders     None        Note:  This document was prepared using Dragon voice recognition software and may include unintentional dictation errors.   Malvina Alm DASEN, MD 03/28/24 [phone]

## 2024-03-28 ENCOUNTER — Emergency Department

## 2024-03-28 LAB — URINALYSIS, ROUTINE W REFLEX MICROSCOPIC
Bilirubin Urine: NEGATIVE
Glucose, UA: NEGATIVE mg/dL
Hgb urine dipstick: NEGATIVE
Ketones, ur: NEGATIVE mg/dL
Leukocytes,Ua: NEGATIVE
Nitrite: NEGATIVE
Protein, ur: NEGATIVE mg/dL
Specific Gravity, Urine: >1.046 — ABNORMAL HIGH (ref 1.005–1.030)
pH: 6 (ref 5.0–8.0)

## 2024-03-28 MED ORDER — IOHEXOL 350 MG/ML SOLN
75.0000 mL | Freq: Once | INTRAVENOUS | Status: AC | PRN
  Administered 2024-03-28: 75 mL via INTRAVENOUS

## 2024-03-28 MED ORDER — LACTATED RINGERS IV BOLUS
1000.0000 mL | Freq: Once | INTRAVENOUS | Status: AC
  Administered 2024-03-28: 1000 mL via INTRAVENOUS

## 2024-03-28 NOTE — ED Notes (Signed)
 Fall precautions placed on pt due to pt being a high fall risk.

## 2024-03-28 NOTE — Discharge Instructions (Signed)
 Please make sure she is eating and drinking well over the next several days and follow-up with your primary care provider as planned.  No evidence of infection in the urine no obvious findings on CT imaging of her head or neck.

## 2024-03-28 NOTE — ED Notes (Signed)
 Pt ambulatory to restroom multiple times but unable to provide urine sample. Pt has not been able to go. Son says that patient is continent and normally urinates frequently .

## 2024-07-17 ENCOUNTER — Other Ambulatory Visit: Payer: Self-pay | Admitting: Physician Assistant

## 2024-07-17 DIAGNOSIS — R269 Unspecified abnormalities of gait and mobility: Secondary | ICD-10-CM

## 2024-07-17 DIAGNOSIS — R479 Unspecified speech disturbances: Secondary | ICD-10-CM

## 2024-07-17 DIAGNOSIS — R410 Disorientation, unspecified: Secondary | ICD-10-CM

## 2024-08-15 ENCOUNTER — Emergency Department

## 2024-08-15 ENCOUNTER — Other Ambulatory Visit: Payer: Self-pay

## 2024-08-15 ENCOUNTER — Inpatient Hospital Stay
Admission: EM | Admit: 2024-08-15 | Discharge: 2024-08-21 | DRG: 871 | Disposition: A | Discharge status: Skilled Nursing Facility | Attending: Emergency Medicine | Admitting: Emergency Medicine

## 2024-08-15 DIAGNOSIS — R6520 Severe sepsis without septic shock: Secondary | ICD-10-CM | POA: Diagnosis present

## 2024-08-15 DIAGNOSIS — F0390 Unspecified dementia without behavioral disturbance: Secondary | ICD-10-CM | POA: Diagnosis present

## 2024-08-15 DIAGNOSIS — E876 Hypokalemia: Secondary | ICD-10-CM | POA: Diagnosis not present

## 2024-08-15 DIAGNOSIS — A409 Streptococcal sepsis, unspecified: Principal | ICD-10-CM | POA: Diagnosis present

## 2024-08-15 DIAGNOSIS — A419 Sepsis, unspecified organism: Secondary | ICD-10-CM | POA: Diagnosis not present

## 2024-08-15 DIAGNOSIS — Z8249 Family history of ischemic heart disease and other diseases of the circulatory system: Secondary | ICD-10-CM

## 2024-08-15 DIAGNOSIS — D509 Iron deficiency anemia, unspecified: Secondary | ICD-10-CM | POA: Diagnosis present

## 2024-08-15 DIAGNOSIS — J9601 Acute respiratory failure with hypoxia: Secondary | ICD-10-CM | POA: Diagnosis not present

## 2024-08-15 DIAGNOSIS — I482 Chronic atrial fibrillation, unspecified: Secondary | ICD-10-CM | POA: Diagnosis present

## 2024-08-15 DIAGNOSIS — E538 Deficiency of other specified B group vitamins: Secondary | ICD-10-CM | POA: Diagnosis present

## 2024-08-15 DIAGNOSIS — J189 Pneumonia, unspecified organism: Secondary | ICD-10-CM | POA: Diagnosis present

## 2024-08-15 DIAGNOSIS — F1011 Alcohol abuse, in remission: Secondary | ICD-10-CM | POA: Diagnosis present

## 2024-08-15 DIAGNOSIS — G2581 Restless legs syndrome: Secondary | ICD-10-CM | POA: Diagnosis present

## 2024-08-15 DIAGNOSIS — R410 Disorientation, unspecified: Secondary | ICD-10-CM | POA: Diagnosis present

## 2024-08-15 DIAGNOSIS — F0394 Unspecified dementia, unspecified severity, with anxiety: Secondary | ICD-10-CM | POA: Diagnosis present

## 2024-08-15 DIAGNOSIS — Z8673 Personal history of transient ischemic attack (TIA), and cerebral infarction without residual deficits: Secondary | ICD-10-CM | POA: Diagnosis not present

## 2024-08-15 DIAGNOSIS — I639 Cerebral infarction, unspecified: Secondary | ICD-10-CM | POA: Diagnosis not present

## 2024-08-15 DIAGNOSIS — J154 Pneumonia due to other streptococci: Secondary | ICD-10-CM | POA: Diagnosis present

## 2024-08-15 DIAGNOSIS — Z79899 Other long term (current) drug therapy: Secondary | ICD-10-CM | POA: Diagnosis not present

## 2024-08-15 DIAGNOSIS — E872 Acidosis, unspecified: Secondary | ICD-10-CM | POA: Diagnosis not present

## 2024-08-15 DIAGNOSIS — I10 Essential (primary) hypertension: Secondary | ICD-10-CM | POA: Diagnosis present

## 2024-08-15 DIAGNOSIS — Z7901 Long term (current) use of anticoagulants: Secondary | ICD-10-CM

## 2024-08-15 DIAGNOSIS — Z87891 Personal history of nicotine dependence: Secondary | ICD-10-CM | POA: Diagnosis not present

## 2024-08-15 DIAGNOSIS — F05 Delirium due to known physiological condition: Secondary | ICD-10-CM | POA: Diagnosis present

## 2024-08-15 DIAGNOSIS — M109 Gout, unspecified: Secondary | ICD-10-CM | POA: Diagnosis present

## 2024-08-15 LAB — COMPREHENSIVE METABOLIC PANEL WITH GFR
ALT: 14 U/L (ref 0–44)
AST: 25 U/L (ref 15–41)
Albumin: 3.5 g/dL (ref 3.5–5.0)
Alkaline Phosphatase: 136 U/L — ABNORMAL HIGH (ref 38–126)
Anion gap: 15 (ref 5–15)
BUN: 20 mg/dL (ref 8–23)
CO2: 23 mmol/L (ref 22–32)
Calcium: 9.9 mg/dL (ref 8.9–10.3)
Chloride: 97 mmol/L — ABNORMAL LOW (ref 98–111)
Creatinine, Ser: 0.76 mg/dL (ref 0.44–1.00)
GFR, Estimated: >60 mL/min (ref >60)
Glucose, Bld: 133 mg/dL — ABNORMAL HIGH (ref 70–99)
Potassium: 4 mmol/L (ref 3.5–5.1)
Sodium: 134 mmol/L — ABNORMAL LOW (ref 135–145)
Total Bilirubin: 2.6 mg/dL — ABNORMAL HIGH (ref 0.0–1.2)
Total Protein: 7.4 g/dL (ref 6.5–8.1)

## 2024-08-15 LAB — CBC WITH DIFFERENTIAL/PLATELET
Abs Immature Granulocytes: 0.11 K/uL — ABNORMAL HIGH (ref 0.00–0.07)
Basophils Absolute: 0.1 K/uL (ref 0.0–0.1)
Basophils Relative: 1 %
Eosinophils Absolute: 0.1 K/uL (ref 0.0–0.5)
Eosinophils Relative: 1 %
HCT: 41.4 % (ref 36.0–46.0)
Hemoglobin: 13.4 g/dL (ref 12.0–15.0)
Immature Granulocytes: 1 %
Lymphocytes Relative: 3 %
Lymphs Abs: 0.5 K/uL — ABNORMAL LOW (ref 0.7–4.0)
MCH: 28.6 pg (ref 26.0–34.0)
MCHC: 32.4 g/dL (ref 30.0–36.0)
MCV: 88.5 fL (ref 80.0–100.0)
Monocytes Absolute: 0.5 K/uL (ref 0.1–1.0)
Monocytes Relative: 3 %
Neutro Abs: 15.2 K/uL — ABNORMAL HIGH (ref 1.7–7.7)
Neutrophils Relative %: 91 %
Platelets: 232 K/uL (ref 150–400)
RBC: 4.68 MIL/uL (ref 3.87–5.11)
RDW: 13.3 % (ref 11.5–15.5)
WBC: 16.5 K/uL — ABNORMAL HIGH (ref 4.0–10.5)
nRBC: 0 % (ref 0.0–0.2)

## 2024-08-15 LAB — PROTIME-INR
INR: 2 — ABNORMAL HIGH (ref 0.8–1.2)
Prothrombin Time: 23.5 s — ABNORMAL HIGH (ref 11.4–15.2)

## 2024-08-15 LAB — LACTIC ACID, PLASMA: Lactic Acid, Venous: 3 mmol/L (ref 0.5–1.9)

## 2024-08-15 MED ORDER — METOPROLOL TARTRATE 5 MG/5ML IV SOLN
2.5000 mg | INTRAVENOUS | Status: DC | PRN
  Administered 2024-08-17: 2.5 mg via INTRAVENOUS
  Filled 2024-08-15 (×2): qty 5

## 2024-08-15 MED ORDER — SODIUM CHLORIDE 0.9 % IV SOLN
500.0000 mg | Freq: Once | INTRAVENOUS | Status: AC
  Administered 2024-08-15: 500 mg via INTRAVENOUS
  Filled 2024-08-15: qty 5

## 2024-08-15 MED ORDER — SODIUM CHLORIDE 0.9 % IV SOLN
1.0000 g | Freq: Once | INTRAVENOUS | Status: AC
  Administered 2024-08-15: 1 g via INTRAVENOUS
  Filled 2024-08-15: qty 10

## 2024-08-15 MED ORDER — DM-GUAIFENESIN ER 30-600 MG PO TB12: 1.0000 | ORAL_TABLET | Freq: Two times a day (BID) | ORAL | Status: DC | PRN

## 2024-08-15 MED ORDER — ACETAMINOPHEN 325 MG PO TABS: 650.0000 mg | ORAL_TABLET | Freq: Four times a day (QID) | ORAL | Status: DC | PRN

## 2024-08-15 MED ORDER — HYDRALAZINE HCL 20 MG/ML IJ SOLN: 5.0000 mg | INTRAMUSCULAR | Status: DC | PRN

## 2024-08-15 MED ORDER — ONDANSETRON HCL 4 MG/2ML IJ SOLN: 4.0000 mg | Freq: Three times a day (TID) | INTRAMUSCULAR | Status: DC | PRN

## 2024-08-15 MED ORDER — ALBUTEROL SULFATE (2.5 MG/3ML) 0.083% IN NEBU: 2.5000 mg | INHALATION_SOLUTION | RESPIRATORY_TRACT | Status: DC | PRN

## 2024-08-15 MED ORDER — LACTATED RINGERS IV BOLUS
1500.0000 mL | Freq: Once | INTRAVENOUS | Status: AC
  Administered 2024-08-16: 1500 mL via INTRAVENOUS

## 2024-08-15 MED ORDER — LACTATED RINGERS IV SOLN: INTRAVENOUS | Status: AC

## 2024-08-15 NOTE — H&P (Signed)
 History and Physical    Ana Herrera FMW:[id number] DOB: 1956/03/19 DOA: 08/15/2024  Referring MD/NP/PA:   PCP: Eliverto Bette Hover, MD   Patient coming from:  The patient is coming from home.     Chief Complaint: Cough, SOB, confusion, disorientation  HPI: Ana Herrera is a 68 y.o. female with medical history significant of hypertension, stroke without deficit, gout, tobacco abuse, alcohol abuse in remission, atrial fibrillation on Coumadin, RLS, SAH, UTI, brain aneurysm with clip repair, who presents with cough, SOB, disorientation.  Per her daughter-in-law at the bedside, patient has been confused and disoriented in the past 3 days.  She has mild SOB and dry cough, no chest pain.  No fever or chills.  No nausea, vomiting, diarrhea or abdominal pain.  No symptoms of UTI.  Patient has history of dementia, but at her normal baseline she is oriented x 3.  When I saw patient in ED, she knows her only him, oriented to place, not oriented to the time.  She moves all extremities normally.  No facial droop or slurred speech.   Data reviewed independently and ED Course: pt was found to have WBC 16.5, lactic acid 3.0, INR 2.0, GFR> 60, temperature 97.5, blood pressure 120/90, heart rate of 90s- 120s, RR 30, oxygen saturation 89-94 on room air in ED.   Chest x-ray: Mild cardiomegaly with slight central congestion. Suspicion of airspace disease at the left base, possible pneumonia.   EKG: I have personally reviewed.  A-fib, heart rate 106, QTc 382, poor R wave progression.   Review of Systems:   General: no fevers, chills, no body weight gain, has fatigue HEENT: no blurry vision, hearing changes or sore throat Respiratory: has dyspnea, coughing, no wheezing CV: no chest pain, no palpitations GI: no nausea, vomiting, abdominal pain, diarrhea, constipation GU: no dysuria, burning on urination, increased urinary frequency, hematuria  Ext: no leg edema Neuro: no unilateral  weakness, numbness, or tingling, no vision change or hearing loss. Has confusion Skin: no rash, no skin tear. MSK: No muscle spasm, no deformity, no limitation of range of movement in spin Heme: No easy bruising.  Travel history: No recent long distant travel.   Allergy: No Known Allergies  Past Medical History:  Diagnosis Date   Alcohol abuse, in remission    Atrial fibrillation (HCC)    Cerebral aneurysm    has metal clip in brain   Chronic atrial fibrillation (HCC)    Dementia (HCC)    Gout    HTN (hypertension)    RLS (restless legs syndrome)    SAH (subarachnoid hemorrhage) (HCC)    Stroke (HCC)    Tobacco abuse    UTI (urinary tract infection)     Past Surgical History:  Procedure Laterality Date   CEREBRAL ANEURYSM REPAIR     with clip per her son   ERCP N/A 11/29/2022   Procedure: ENDOSCOPIC RETROGRADE CHOLANGIOPANCREATOGRAPHY (ERCP);  Surgeon: Jinny Carmine, MD;  Location: [facility] ENDOSCOPY;  Service: Endoscopy;  Laterality: N/A;   ERCP N/A 02/01/2023   Procedure: ENDOSCOPIC RETROGRADE CHOLANGIOPANCREATOGRAPHY (ERCP);  Surgeon: Jinny Carmine, MD;  Location: [facility] ENDOSCOPY;  Service: Endoscopy;  Laterality: N/A;  STENT REMOVAL    Social History:  reports that she quit smoking about 20 months ago. Her smoking use included cigarettes. She has never used smokeless tobacco. She reports that she does not currently use alcohol. She reports that she does not use drugs.  Family History:  Family History  Problem Relation Age of  Onset   Hypertension Father    Hypertension Brother      Prior to Admission medications   Medication Sig Start Date End Date Taking? Authorizing Provider  apixaban  (ELIQUIS ) 5 MG TABS tablet Take 1 tablet (5 mg total) by mouth 2 (two) times daily. 05/23/23 08/21/23  Darci Pore, MD  diltiazem  (CARDIZEM  CD) 240 MG 24 hr capsule Take by mouth. 07/21/22 07/21/23  [provider]  donepezil  (ARICEPT ) 10 MG tablet Take 1 tablet (10 mg  total) by mouth at bedtime. 05/23/23   Darci Pore, MD  feeding supplement (ENSURE ENLIVE / ENSURE PLUS) LIQD Take 237 mLs by mouth 3 (three) times daily between meals. 05/23/23   Darci Pore, MD  lisinopril  (ZESTRIL ) 20 MG tablet Hold until followup with primary care doctor due to low blood pressure and acute kidney injury. 12/02/22   Awanda City, MD  Multiple Vitamin (MULTIVITAMIN WITH MINERALS) TABS tablet Take 1 tablet by mouth daily. 05/24/23   Darci Pore, MD  nicotine  (NICODERM CQ  - DOSED IN MG/24 HOURS) 21 mg/24hr patch Place 1 patch (21 mg total) onto the skin daily. 12/03/22   Awanda City, MD    Physical Exam: Vitals:   08/15/24 2346 08/16/24 0023 08/16/24 0027 08/16/24 0030  BP:   110/77 107/76  Pulse:  72 82   Resp:  17 (!) 36 (!) 26  Temp:      TempSrc:      SpO2:  100% 95% 96%  Weight: 68.4 kg     Height: 5' 6 (1.676 m)      General: Not in acute distress HEENT:       Eyes: PERRL, EOMI, no jaundice       ENT: No discharge from the ears and nose, no pharynx injection, no tonsillar enlargement.        Neck: No JVD, no bruit, no mass felt. Heme: No neck lymph node enlargement. Cardiac: S1/S2, irregularly irregular rhythm, no murmurs, No gallops or rubs. Respiratory: No rales, wheezing, rhonchi or rubs. GI: Soft, nondistended, nontender, no rebound pain, no organomegaly, BS present. GU: No hematuria Ext: No pitting leg edema bilaterally. 1+DP/PT pulse bilaterally. Musculoskeletal: No joint deformities, No joint redness or warmth, no limitation of ROM in spin. Skin: No rashes.  Neuro: Confused, orientated to person and place, not to the time.  Cranial nerves II-XII grossly intact, moves all extremities normally. Psych: Patient is not psychotic, no suicidal or hemocidal ideation.  Labs on Admission: I have personally reviewed following labs and imaging studies  CBC: Recent Labs  Lab 08/15/24 2108  WBC 16.5*  NEUTROABS 15.2*  HGB 13.4  HCT 41.4   MCV 88.5  PLT 232   Basic Metabolic Panel: Recent Labs  Lab 08/15/24 2108  NA 134*  K 4.0  CL 97*  CO2 23  GLUCOSE 133*  BUN 20  CREATININE 0.76  CALCIUM 9.9   GFR: Estimated Creatinine Clearance: 63.9 mL/min (by C-G formula based on SCr of 0.76 mg/dL). Liver Function Tests: Recent Labs  Lab 08/15/24 2108  AST 25  ALT 14  ALKPHOS 136*  BILITOT 2.6*  PROT 7.4  ALBUMIN 3.5   No results for input(s): LIPASE, AMYLASE in the last 168 hours. No results for input(s): AMMONIA in the last 168 hours. Coagulation Profile: Recent Labs  Lab 08/15/24 2108  INR 2.0*   Cardiac Enzymes: No results for input(s): CKTOTAL, CKMB, CKMBINDEX, TROPONINI in the last 168 hours. BNP (last 3 results) No results for input(s): PROBNP in the  last 8760 hours. HbA1C: No results for input(s): HGBA1C in the last 72 hours. CBG: No results for input(s): GLUCAP in the last 168 hours. Lipid Profile: No results for input(s): CHOL, HDL, LDLCALC, TRIG, CHOLHDL, LDLDIRECT in the last 72 hours. Thyroid Function Tests: No results for input(s): TSH, T4TOTAL, FREET4, T3FREE, THYROIDAB in the last 72 hours. Anemia Panel: No results for input(s): VITAMINB12, FOLATE, FERRITIN, TIBC, IRON, RETICCTPCT in the last 72 hours. Urine analysis:    Component Value Date/Time   COLORURINE YELLOW (A) 03/27/2024 0415   APPEARANCEUR CLEAR (A) 03/27/2024 0415   LABSPEC >1.046 (H) 03/27/2024 0415   PHURINE 6.0 03/27/2024 0415   GLUCOSEU NEGATIVE 03/27/2024 0415   HGBUR NEGATIVE 03/27/2024 0415   BILIRUBINUR NEGATIVE 03/27/2024 0415   KETONESUR NEGATIVE 03/27/2024 0415   PROTEINUR NEGATIVE 03/27/2024 0415   NITRITE NEGATIVE 03/27/2024 0415   LEUKOCYTESUR NEGATIVE 03/27/2024 0415   Sepsis Labs: @LABRCNTIP (procalcitonin:4,lacticidven:4) )No results found for this or any previous visit (from the past 240 hours).   Radiological Exams on  Admission:   Assessment/Plan Principal Problem:   CAP (community acquired pneumonia) Active Problems:   Confusion and disorientation   Sepsis (HCC)   HTN (hypertension)   Stroke (HCC)   Atrial fibrillation, chronic (HCC)   Dementia without behavioral disturbance (HCC)   Assessment and Plan:  Sepsis due to CAP (community acquired pneumonia): Patient has intermittent oxygen desaturation to 89%, currently 94% on room air.  Chest x-ray with possible infiltration at the left base, possible pneumonia.  Patient meets critical for sepsis with WBC 16.5, tachycardia with heart rate up to 123, RR 30.  Lactic acid 3.0.  Since patient has confusion, cannot completely rule out possibility of aspiration pneumonia.  - Will admit to PCU as inpt  - IV Unasyn and azithromycin (patient received 1 dose of Rocephin  in ED). - Mucinex for cough  - Bronchodilators - Urine legionella and S. pneumococcal antigen - Follow up blood culture x2, sputum culture - will trend lactic acid level per sepsis protocol - IVF: 1.5L of NS bolus in ED, followed by 75 mL per hour of NS   Confusion and disorientation: Likely due to ongoing infection and sepsis. -Frequent neurocheck - Fall precaution - Follow-up CT of head - f/u UA  HTN (hypertension) -IV hydralazine  as needed - Continue Cardizem  which is also for A-fib - Hold lisinopril  since patient at risk of developing hypotension due to sepsis  History of stroke (HCC) -Lipitor and Eliquis   Atrial fibrillation, chronic (HCC): Heart rate 90s-120s. -Continue Cardizem  to 40 mg daily - As needed metoprolol  2.5 mg every 2 hour for heart rate> 125 - Continue Eliquis   Dementia without behavioral disturbance (HCC) - Donepezil       DVT ppx: On Eliquis   Code Status: Full code   Family Communication:  Yes, patient's daughter-in-law   at bed side.       Disposition Plan:  Anticipate discharge back to previous environment  Consults called: None  Admission  status and Level of care: Progressive:  as inpt        Dispo: The patient is from: Home              Anticipated d/c is to: Home              Anticipated d/c date is: 2 days              Patient currently is not medically stable to d/c.    Severity of Illness:  The appropriate  patient status for this patient is INPATIENT. Inpatient status is judged to be reasonable and necessary in order to provide the required intensity of service to ensure the patient's safety. The patient's presenting symptoms, physical exam findings, and initial radiographic and laboratory data in the context of their chronic comorbidities is felt to place them at high risk for further clinical deterioration. Furthermore, it is not anticipated that the patient will be medically stable for discharge from the hospital within 2 midnights of admission.   * I certify that at the point of admission it is my clinical judgment that the patient will require inpatient hospital care spanning beyond 2 midnights from the point of admission due to high intensity of service, high risk for further deterioration and high frequency of surveillance required.*       Date of Service 08/16/2024    Caleb Exon [facility]   If 7PM-7AM, please contact night-coverage [email or web address] 08/16/2024, 1:08 AM

## 2024-08-15 NOTE — ED Triage Notes (Signed)
 Patient brought in via [facility] from home with complaints of possible UTI. Patient and family report increased confusion. Patient alert and oriented x3, disoriented to time. Patient does have hx of dementia, denies pain to this RN.

## 2024-08-15 NOTE — ED Provider Notes (Signed)
 [facility] Provider Note    Event Date/Time   First MD Initiated Contact with Patient 08/15/24 2257     (approximate)   History   Dysuria   HPI  Ana Herrera is a 68 y.o. female with history of dementia who presents to the emergency department today because concerns for increased confusion.  History is primarily obtained from family at bedside.  They state over the past couple of days they noticed that she has become more confused than normal.  They are concerned for possible infection.  She states that she has had infections in the past.     Physical Exam   Triage Vital Signs: ED Triage Vitals  Encounter Vitals Group     BP 08/15/24 2057 (!) 140/65     Girls Systolic BP Percentile --      Girls Diastolic BP Percentile --      Boys Systolic BP Percentile --      Boys Diastolic BP Percentile --      Pulse Rate 08/15/24 2057 97     Resp 08/15/24 2057 (!) 30     Temp 08/15/24 2057 (!) 97.5 F (36.4 C)     Temp Source 08/15/24 2057 Oral     SpO2 08/15/24 2057 (!) 89 %     Weight 08/15/24 2058 150 lb 12.7 oz (68.4 kg)     Height 08/15/24 2058 5' 6 (1.676 m)     Head Circumference --      Peak Flow --      Pain Score 08/15/24 2058 0     Pain Loc --      Pain Education --      Exclude from Growth Chart --     Most recent vital signs: Vitals:   08/15/24 2102 08/15/24 2146  BP: (!) 129/90   Pulse: (!) 123   Resp: (!) 30   Temp:    SpO2: 94% 94%   General: Awake, alert, not completely oriented. CV:  Good peripheral perfusion. Tachycardia. Resp:  Normal effort. Tachypnea. Abd:  No distention.   ED Results / Procedures / Treatments   Labs (all labs ordered are listed, but only abnormal results are displayed) Labs Reviewed  COMPREHENSIVE METABOLIC PANEL WITH GFR - Abnormal; Notable for the following components:      Result Value   Sodium 134 (*)    Chloride 97 (*)    Glucose, Bld 133 (*)    Alkaline Phosphatase 136 (*)     Total Bilirubin 2.6 (*)    All other components within normal limits  LACTIC ACID, PLASMA - Abnormal; Notable for the following components:   Lactic Acid, Venous 3.0 (*)    All other components within normal limits  CBC WITH DIFFERENTIAL/PLATELET - Abnormal; Notable for the following components:   WBC 16.5 (*)    Neutro Abs 15.2 (*)    Lymphs Abs 0.5 (*)    Abs Immature Granulocytes 0.11 (*)    All other components within normal limits  PROTIME-INR - Abnormal; Notable for the following components:   Prothrombin Time 23.5 (*)    INR 2.0 (*)    All other components within normal limits  CULTURE, BLOOD (ROUTINE X 2)  CULTURE, BLOOD (ROUTINE X 2)  EXPECTORATED SPUTUM ASSESSMENT W GRAM STAIN, RFLX TO RESP C  URINALYSIS, W/ REFLEX TO CULTURE (INFECTION SUSPECTED)  LACTIC ACID, PLASMA  LACTIC ACID, PLASMA  LACTIC ACID, PLASMA  HIV ANTIBODY (ROUTINE TESTING W REFLEX)  LEGIONELLA  PNEUMOPHILA SEROGP 1 UR AG  STREP PNEUMONIAE URINARY ANTIGEN  BASIC METABOLIC PANEL WITH GFR  CBC    RADIOLOGY I independently interpreted and visualized the CXR. My interpretation: No large pneumonia Radiology interpretation:  IMPRESSION:  Mild cardiomegaly with slight central congestion. Suspicion of  airspace disease at the left base, possible pneumonia.      PROCEDURES:  Critical Care performed: No    MEDICATIONS ORDERED IN ED: Medications  azithromycin (ZITHROMAX) 500 mg in sodium chloride  0.9 % 250 mL IVPB (500 mg Intravenous New Bag/Given 08/15/24 2321)  lactated ringers  bolus 1,500 mL (has no administration in time range)  lactated ringers  infusion (has no administration in time range)  albuterol (PROVENTIL) (2.5 MG/3ML) 0.083% nebulizer solution 2.5 mg (has no administration in time range)  dextromethorphan-guaiFENesin (MUCINEX DM) 30-600 MG per 12 hr tablet 1 tablet (has no administration in time range)  metoprolol  tartrate (LOPRESSOR ) injection 2.5 mg (has no administration in time  range)  ondansetron  (ZOFRAN ) injection 4 mg (has no administration in time range)  hydrALAZINE  (APRESOLINE ) injection 5 mg (has no administration in time range)  acetaminophen  (TYLENOL ) tablet 650 mg (has no administration in time range)  cefTRIAXone  (ROCEPHIN ) 1 g in sodium chloride  0.9 % 100 mL IVPB (0 g Intravenous Stopped 08/15/24 2255)     IMPRESSION / MDM / ASSESSMENT AND PLAN / ED COURSE  I reviewed the triage vital signs and the nursing notes.                              Differential diagnosis includes, but is not limited to, pneumonia, UTI, anemia, electrolyte abnormality  Patient's presentation is most consistent with acute presentation with potential threat to life or bodily function.   Patient presented to the emergency department today accompanied by family because of concerns for increased confusion over baseline dementia.  On exam patient is awake and alert although not oriented.  Was found to be slightly hypoxic on room air as well as tachypneic and tachycardic.  Blood work with concerning white blood cell elevation.  Chest x-ray concerning for possible infiltrate.  This could explain the tachypnea and hypoxia.  Will start broad-spectrum antibiotics. Will discuss with hospitalist service for admission.      FINAL CLINICAL IMPRESSION(S) / ED DIAGNOSES   Final diagnoses:  Confusion  Pneumonia due to infectious organism, unspecified laterality, unspecified part of lung     Note:  This document was prepared using Dragon voice recognition software and may include unintentional dictation errors.    Floy Roberts, MD 08/16/24 [phone]

## 2024-08-16 ENCOUNTER — Inpatient Hospital Stay

## 2024-08-16 DIAGNOSIS — J189 Pneumonia, unspecified organism: Secondary | ICD-10-CM | POA: Diagnosis not present

## 2024-08-16 LAB — BASIC METABOLIC PANEL WITH GFR
Anion gap: 11 (ref 5–15)
BUN: 17 mg/dL (ref 8–23)
CO2: 24 mmol/L (ref 22–32)
Calcium: 8.7 mg/dL — ABNORMAL LOW (ref 8.9–10.3)
Chloride: 100 mmol/L (ref 98–111)
Creatinine, Ser: 0.58 mg/dL (ref 0.44–1.00)
GFR, Estimated: >60 mL/min (ref >60)
Glucose, Bld: 107 mg/dL — ABNORMAL HIGH (ref 70–99)
Potassium: 3.2 mmol/L — ABNORMAL LOW (ref 3.5–5.1)
Sodium: 135 mmol/L (ref 135–145)

## 2024-08-16 LAB — PHOSPHORUS: Phosphorus: 2.1 mg/dL — ABNORMAL LOW (ref 2.5–4.6)

## 2024-08-16 LAB — IRON AND TIBC
Iron: 14 ug/dL — ABNORMAL LOW (ref 28–170)
Saturation Ratios: 5 % — ABNORMAL LOW (ref 10.4–31.8)
TIBC: 259 ug/dL (ref 250–450)
UIBC: 246 ug/dL

## 2024-08-16 LAB — URINALYSIS, W/ REFLEX TO CULTURE (INFECTION SUSPECTED)
Bilirubin Urine: NEGATIVE
Glucose, UA: NEGATIVE mg/dL
Hgb urine dipstick: NEGATIVE
Ketones, ur: NEGATIVE mg/dL
Leukocytes,Ua: NEGATIVE
Nitrite: POSITIVE — AB
Protein, ur: 100 mg/dL — AB
Specific Gravity, Urine: 1.023 (ref 1.005–1.030)
pH: 5 (ref 5.0–8.0)

## 2024-08-16 LAB — FOLATE: Folate: 12.1 ng/mL (ref >5.9)

## 2024-08-16 LAB — CBC
HCT: 34 % — ABNORMAL LOW (ref 36.0–46.0)
Hemoglobin: 11.1 g/dL — ABNORMAL LOW (ref 12.0–15.0)
MCH: 29.1 pg (ref 26.0–34.0)
MCHC: 32.6 g/dL (ref 30.0–36.0)
MCV: 89 fL (ref 80.0–100.0)
Platelets: 229 K/uL (ref 150–400)
RBC: 3.82 MIL/uL — ABNORMAL LOW (ref 3.87–5.11)
RDW: 13.3 % (ref 11.5–15.5)
WBC: 14.2 K/uL — ABNORMAL HIGH (ref 4.0–10.5)
nRBC: 0 % (ref 0.0–0.2)

## 2024-08-16 LAB — MAGNESIUM: Magnesium: 1.6 mg/dL — ABNORMAL LOW (ref 1.7–2.4)

## 2024-08-16 LAB — STREP PNEUMONIAE URINARY ANTIGEN: Strep Pneumo Urinary Antigen: POSITIVE — AB

## 2024-08-16 LAB — LACTIC ACID, PLASMA: Lactic Acid, Venous: 1.9 mmol/L (ref 0.5–1.9)

## 2024-08-16 LAB — VITAMIN D 25 HYDROXY (VIT D DEFICIENCY, FRACTURES): Vit D, 25-Hydroxy: 10.27 ng/mL — ABNORMAL LOW (ref 30–100)

## 2024-08-16 LAB — HIV ANTIBODY (ROUTINE TESTING W REFLEX): HIV Screen 4th Generation wRfx: NONREACTIVE

## 2024-08-16 LAB — VITAMIN B12: Vitamin B-12: <150 pg/mL — ABNORMAL LOW (ref 180–914)

## 2024-08-16 MED ORDER — HYDROCOD POLI-CHLORPHE POLI ER 10-8 MG/5ML PO SUER: 5.0000 mL | Freq: Two times a day (BID) | ORAL | Status: DC | PRN

## 2024-08-16 MED ORDER — GUAIFENESIN ER 600 MG PO TB12
600.0000 mg | ORAL_TABLET | Freq: Two times a day (BID) | ORAL | Status: DC
  Administered 2024-08-16 – 2024-08-21 (×11): 600 mg via ORAL
  Filled 2024-08-16 (×11): qty 1

## 2024-08-16 MED ORDER — MAGNESIUM SULFATE 2 GM/50ML IV SOLN
2.0000 g | Freq: Once | INTRAVENOUS | Status: AC
  Administered 2024-08-16: 2 g via INTRAVENOUS
  Filled 2024-08-16: qty 50

## 2024-08-16 MED ORDER — K PHOS MONO-SOD PHOS DI & MONO 155-852-130 MG PO TABS
500.0000 mg | ORAL_TABLET | Freq: Four times a day (QID) | ORAL | Status: AC
  Administered 2024-08-16 (×3): 500 mg via ORAL
  Filled 2024-08-16 (×3): qty 2

## 2024-08-16 MED ORDER — ADULT MULTIVITAMIN W/MINERALS CH
1.0000 | ORAL_TABLET | Freq: Every day | ORAL | Status: DC
  Administered 2024-08-16: 1 via ORAL
  Filled 2024-08-16: qty 1

## 2024-08-16 MED ORDER — DILTIAZEM HCL ER COATED BEADS 120 MG PO CP24
240.0000 mg | ORAL_CAPSULE | Freq: Every day | ORAL | Status: DC
  Administered 2024-08-16 – 2024-08-19 (×4): 240 mg via ORAL
  Filled 2024-08-16: qty 1
  Filled 2024-08-16 (×3): qty 2

## 2024-08-16 MED ORDER — DONEPEZIL HCL 5 MG PO TABS: 10.0000 mg | ORAL_TABLET | Freq: Every day | ORAL | Status: DC

## 2024-08-16 MED ORDER — SODIUM CHLORIDE 0.9 % IV SOLN
3.0000 g | Freq: Four times a day (QID) | INTRAVENOUS | Status: DC
  Administered 2024-08-16 – 2024-08-20 (×17): 3 g via INTRAVENOUS
  Filled 2024-08-16 (×19): qty 8

## 2024-08-16 MED ORDER — APIXABAN 5 MG PO TABS
5.0000 mg | ORAL_TABLET | Freq: Two times a day (BID) | ORAL | Status: DC
  Administered 2024-08-16 – 2024-08-21 (×11): 5 mg via ORAL
  Filled 2024-08-16 (×10): qty 1

## 2024-08-16 MED ORDER — SODIUM CHLORIDE 0.9 % IV SOLN
500.0000 mg | INTRAVENOUS | Status: DC
  Administered 2024-08-16: 500 mg via INTRAVENOUS
  Filled 2024-08-16: qty 5

## 2024-08-16 MED ORDER — POTASSIUM CHLORIDE 20 MEQ PO PACK
40.0000 meq | PACK | Freq: Once | ORAL | Status: AC
  Administered 2024-08-16: 40 meq via ORAL
  Filled 2024-08-16: qty 2

## 2024-08-16 NOTE — ED Notes (Signed)
 This tech assisted pt to the bathroom. Pt urinated in toilet. Pt was assisted back into bed. No other needs verbalized at this time. Fall bundle in place.

## 2024-08-16 NOTE — Progress Notes (Signed)
 [facility] Progress Note  Patient: Ana Herrera    FMW:[id number]  DOA: 08/15/2024     Date of Service: the patient was seen and examined on 08/16/2024  Chief Complaint  Patient presents with   Dysuria   Brief hospital course:  Ana Herrera is a 68 y.o. female with medical history significant of hypertension, stroke without deficit, gout, tobacco abuse, alcohol abuse in remission, atrial fibrillation on Coumadin, RLS, SAH, UTI, brain aneurysm with clip repair, who presents with cough, SOB, disorientation.   Per her daughter-in-law at the bedside, patient has been confused and disoriented in the past 3 days.  She has mild SOB and dry cough, no chest pain.  No fever or chills.  No nausea, vomiting, diarrhea or abdominal pain.  No symptoms of UTI.  Patient has history of dementia, but at her normal baseline she is oriented x 3.  When I saw patient in ED, she knows her only him, oriented to place, not oriented to the time.  She moves all extremities normally.  No facial droop or slurred speech.     Data reviewed independently and ED Course: pt was found to have WBC 16.5, lactic acid 3.0, INR 2.0, GFR> 60, temperature 97.5, blood pressure 120/90, heart rate of 90s- 120s, RR 30, oxygen saturation 89-94 on room air in ED.    Chest x-ray: Mild cardiomegaly with slight central congestion. Suspicion of airspace disease at the left base, possible pneumonia.     EKG: I have personally reviewed.  A-fib, heart rate 106, QTc 382, poor R wave progression.   Assessment and Plan:  # Sepsis secondary to community-acquired pneumonia, # Streptococcal pneumonia, urine strep antigen positive S/p ceftriaxone  and IV fluid bolus given in the ED Lactic acid trended down, 3.0>>1.9, VS stable Continue Unasyn and azithromycin Mucinex 600 twice daily Tussionex as needed for cough Blood culture NGTD  #Confusion and disorientation most likely due to sepsis secondary to pneumonia No focal  deficits. Continue fall precautions CT head negative for any acute findings  # HTN, chronic A-fib Continue with Cardizem  and Eliquis  Monitor BP and titrate medication accordingly  # History of CVA: Continue Lipitor and Eliquis    # Hypokalemia, potassium repleted. # Hypomagnesemia, mag repleted. # Hypophosphatemia, Phos repleted. Monitor electrolytes and replete as needed.   Dementia without behavioral disturbance. - Donepezil   Body mass index is 24.34 kg/m.  Interventions:  Diet: Dysphagia 2 diet DVT Prophylaxis: Eliquis   Advance goals of care discussion: Full code  Family Communication: family was present at bedside, at the time of interview.  The pt provided permission to discuss medical plan with the family. Opportunity was given to ask question and all questions were answered satisfactorily.   Disposition:  Pt is from home, admitted with sepsis due to pneumonia, still on IV antibiotics, which precludes a safe discharge. Discharge to home, when stable, may need few days to improve.  Subjective: No significant events overnight, patient's breathing is getting better, still has productive cough.  Denied any abdominal pain, no chest pain or palpitation, no any other complaints.  Physical Exam: General: NAD, lying comfortably Appear in no distress, affect appropriate Eyes: PERRLA ENT: Oral Mucosa Clear, moist  Neck: no JVD,  Cardiovascular: S1 and S2 Present, no Murmur,  Respiratory: good respiratory effort, Bilateral Air entry equal and Decreased, mild crackles b/l, no wheezes Abdomen: Bowel Sound present, Soft and no tenderness,  Skin: no rashes Extremities: no Pedal edema, no calf tenderness Neurologic: without any new focal findings  Gait not checked due to patient safety concerns  Vitals:   08/16/24 0600 08/16/24 0950 08/16/24 1006 08/16/24 1434  BP: 127/88 (!) 132/90  (!) 145/88  Pulse: 79 71  86  Resp: 18 20  18   Temp:   98 F (36.7 C) 97.7 F (36.5 C)   TempSrc:   Axillary Oral  SpO2: 98% 95%  97%  Weight:      Height:       No intake or output data in the 24 hours ending 08/16/24 1659 Filed Weights   08/15/24 2058 08/15/24 2346  Weight: 68.4 kg 68.4 kg    Data Reviewed: I have personally reviewed and interpreted daily labs, tele strips, imagings as discussed above. I reviewed all nursing notes, pharmacy notes, vitals, pertinent old records I have discussed plan of care as described above with RN and patient/family.  CBC: Recent Labs  Lab 08/15/24 2108 08/16/24 0116  WBC 16.5* 14.2*  NEUTROABS 15.2*  --   HGB 13.4 11.1*  HCT 41.4 34.0*  MCV 88.5 89.0  PLT 232 229   Basic Metabolic Panel: Recent Labs  Lab 08/15/24 2108 08/16/24 0116  NA 134* 135  K 4.0 3.2*  CL 97* 100  CO2 23 24  GLUCOSE 133* 107*  BUN 20 17  CREATININE 0.76 0.58  CALCIUM 9.9 8.7*  MG  --  1.6*  PHOS  --  2.1*    Studies: CT HEAD WO CONTRAST ( ) Result Date: 08/16/2024 CLINICAL DATA:  Altered mental status EXAM: CT HEAD WITHOUT CONTRAST TECHNIQUE: Contiguous axial images were obtained from the base of the skull through the vertex without intravenous contrast. RADIATION DOSE REDUCTION: This exam was performed according to the departmental dose-optimization program which includes automated exposure control, adjustment of the mA and/or kV according to patient size and/or use of iterative reconstruction technique. COMPARISON:  02/29/2024 FINDINGS: Brain: Chronic atrophic changes and chronic white matter ischemic changes are seen. Findings of prior aneurysm clipping are noted on the right stable from the prior exam. Right-sided craniotomy is noted and stable. No acute hemorrhage, acute infarction or space-occupying mass lesion is noted. Vascular: Changes of prior right-sided aneurysm clipping. Skull: Prior right craniotomy. Sinuses/Orbits: Paranasal sinuses demonstrate significant mucosal thickening consistent with chronic sinusitis. No orbital  abnormality is noted. Other: None IMPRESSION: Chronic changes consistent with prior aneurysm clipping. Chronic atrophic and ischemic changes without acute abnormality. Electronically Signed   By: Oneil Devonshire M.D.   On: 08/16/2024 01:48   DG Chest Port 1 View if patient is in a treatment room. Result Date: 08/15/2024 CLINICAL DATA:  Altered possible sepsis EXAM: PORTABLE CHEST 1 VIEW COMPARISON:  02/29/2024 FINDINGS: Mild cardiomegaly with slight central congestion. Suspicion of airspace disease at the left base. No pleural effusion or pneumothorax. Aortic atherosclerosis. IMPRESSION: Mild cardiomegaly with slight central congestion. Suspicion of airspace disease at the left base, possible pneumonia. Electronically Signed   By: Luke Bun M.D.   On: 08/15/2024 21:24    Scheduled Meds:  apixaban   5 mg Oral BID   diltiazem   240 mg Oral Daily   guaiFENesin  600 mg Oral BID   multivitamin with minerals  1 tablet Oral Daily   phosphorus  500 mg Oral QID   Continuous Infusions:  ampicillin-sulbactam (UNASYN) IV Stopped (08/16/24 1259)   azithromycin (ZITHROMAX) 500 mg in sodium chloride  0.9 % 250 mL IVPB     lactated ringers  75 mL/hr at 08/16/24 0138   PRN Meds: acetaminophen , albuterol, chlorpheniramine-HYDROcodone, hydrALAZINE , metoprolol  tartrate,  ondansetron  (ZOFRAN ) IV  Time spent: 55 minutes  Author: ELVAN SOR. MD [facility] 08/16/2024 4:59 PM  To reach On-call, see care teams to locate the attending and reach out to them via [email or web address]. If 7PM-7AM, please contact night-coverage If you still have difficulty reaching the attending provider, please page the [facility] (Director on Call) for [facility] on amion for assistance.

## 2024-08-16 NOTE — ED Notes (Signed)
 This tech assisted pt to the bathroom. Pt urinated in toilet. Pt was assisted back into bed. Call bell in reach.

## 2024-08-16 NOTE — Consult Note (Signed)
 PHARMACY CONSULT NOTE   Pharmacy Consult for Electrolyte Monitoring and Replacement   Recent Labs: Potassium (mmol/L)  Date Value  08/16/2024 3.2 (L)   Magnesium  (mg/dL)  Date Value  88/86/7974 1.6 (L)   Calcium (mg/dL)  Date Value  88/86/7974 8.7 (L)   Albumin (g/dL)  Date Value  88/87/7974 3.5   Phosphorus (mg/dL)  Date Value  88/86/7974 2.1 (L)   Sodium (mmol/L)  Date Value  08/16/2024 135     Assessment: Patient is a 68 y/o female with a PMH of  HTN, stroke without deficit, gout, Etoh abuse in remission, atrial fibrillation on Coumadin, RLS, and SAH, presenting with AMS, cough and SOB. Pharmacy is asked to follow and replace electrolytes.   Goal of Therapy:  Electrolytes WNL  Plan:  --Mg 1.6, will replace with 2G of magnesium  sulfate IV. --Phosphorous 2.1, will replace with Kphos 500 mg x3 doses --Follow-up electrolytes with AM labs tomorrow    Ana Herrera ,PharmD Clinical Pharmacist 08/16/2024 1:02 PM

## 2024-08-16 NOTE — ED Notes (Signed)
 This EDT assisted Pt to BR. Pt ambulated to hallway BR with minor steadying assist. Pt back in bed with bed alarm on.

## 2024-08-17 DIAGNOSIS — J189 Pneumonia, unspecified organism: Secondary | ICD-10-CM | POA: Diagnosis not present

## 2024-08-17 LAB — BASIC METABOLIC PANEL WITH GFR
Anion gap: 11 (ref 5–15)
BUN: 9 mg/dL (ref 8–23)
CO2: 26 mmol/L (ref 22–32)
Calcium: 8.9 mg/dL (ref 8.9–10.3)
Chloride: 102 mmol/L (ref 98–111)
Creatinine, Ser: 0.43 mg/dL — ABNORMAL LOW (ref 0.44–1.00)
GFR, Estimated: >60 mL/min (ref >60)
Glucose, Bld: 93 mg/dL (ref 70–99)
Potassium: 3.2 mmol/L — ABNORMAL LOW (ref 3.5–5.1)
Sodium: 139 mmol/L (ref 135–145)

## 2024-08-17 LAB — CBC
HCT: 36.3 % (ref 36.0–46.0)
Hemoglobin: 11.7 g/dL — ABNORMAL LOW (ref 12.0–15.0)
MCH: 28.6 pg (ref 26.0–34.0)
MCHC: 32.2 g/dL (ref 30.0–36.0)
MCV: 88.8 fL (ref 80.0–100.0)
Platelets: 309 K/uL (ref 150–400)
RBC: 4.09 MIL/uL (ref 3.87–5.11)
RDW: 13.3 % (ref 11.5–15.5)
WBC: 11.3 K/uL — ABNORMAL HIGH (ref 4.0–10.5)
nRBC: 0 % (ref 0.0–0.2)

## 2024-08-17 LAB — LEGIONELLA PNEUMOPHILA SEROGP 1 UR AG: L. pneumophila Serogp 1 Ur Ag: NEGATIVE

## 2024-08-17 LAB — PHOSPHORUS: Phosphorus: 2.7 mg/dL (ref 2.5–4.6)

## 2024-08-17 LAB — MAGNESIUM: Magnesium: 1.8 mg/dL (ref 1.7–2.4)

## 2024-08-17 MED ORDER — POTASSIUM CHLORIDE 20 MEQ PO PACK
40.0000 meq | PACK | Freq: Two times a day (BID) | ORAL | Status: AC
  Administered 2024-08-17 (×2): 40 meq via ORAL
  Filled 2024-08-17 (×2): qty 2

## 2024-08-17 MED ORDER — VITAMIN D (ERGOCALCIFEROL) 1.25 MG (50000 UNIT) PO CAPS
50000.0000 [IU] | ORAL_CAPSULE | ORAL | Status: DC
  Administered 2024-08-17: 50000 [IU] via ORAL
  Filled 2024-08-17: qty 1

## 2024-08-17 MED ORDER — THIAMINE HCL 100 MG/ML IJ SOLN
100.0000 mg | Freq: Every day | INTRAMUSCULAR | Status: DC
  Filled 2024-08-17 (×3): qty 2

## 2024-08-17 MED ORDER — POLYSACCHARIDE IRON COMPLEX 150 MG PO CAPS
150.0000 mg | ORAL_CAPSULE | Freq: Every day | ORAL | Status: DC
  Administered 2024-08-17 – 2024-08-20 (×4): 150 mg via ORAL
  Filled 2024-08-17 (×6): qty 1

## 2024-08-17 MED ORDER — PRAVASTATIN SODIUM 40 MG PO TABS
40.0000 mg | ORAL_TABLET | Freq: Every day | ORAL | Status: DC
  Administered 2024-08-17 – 2024-08-19 (×3): 40 mg via ORAL
  Filled 2024-08-17 (×3): qty 1

## 2024-08-17 MED ORDER — VITAMIN B-12 1000 MCG PO TABS: 1000.0000 ug | ORAL_TABLET | Freq: Every day | ORAL | Status: DC

## 2024-08-17 MED ORDER — AZITHROMYCIN 250 MG PO TABS
500.0000 mg | ORAL_TABLET | Freq: Every day | ORAL | Status: AC
  Administered 2024-08-17 – 2024-08-19 (×3): 500 mg via ORAL
  Filled 2024-08-17 (×3): qty 2

## 2024-08-17 MED ORDER — ENSURE PLUS HIGH PROTEIN PO LIQD
237.0000 mL | Freq: Two times a day (BID) | ORAL | Status: DC
  Administered 2024-08-17 – 2024-08-21 (×10): 237 mL via ORAL

## 2024-08-17 MED ORDER — ADULT MULTIVITAMIN W/MINERALS CH
1.0000 | ORAL_TABLET | Freq: Every day | ORAL | Status: DC
  Administered 2024-08-17 – 2024-08-21 (×5): 1 via ORAL
  Filled 2024-08-17 (×5): qty 1

## 2024-08-17 MED ORDER — LORAZEPAM 2 MG/ML IJ SOLN
1.0000 mg | INTRAMUSCULAR | Status: AC | PRN
  Administered 2024-08-17: 2 mg via INTRAVENOUS
  Filled 2024-08-17: qty 1

## 2024-08-17 MED ORDER — MAGNESIUM SULFATE 2 GM/50ML IV SOLN
2.0000 g | Freq: Once | INTRAVENOUS | Status: AC
  Administered 2024-08-17: 2 g via INTRAVENOUS
  Filled 2024-08-17: qty 50

## 2024-08-17 MED ORDER — CYANOCOBALAMIN 1000 MCG/ML IJ SOLN
1000.0000 ug | Freq: Every day | INTRAMUSCULAR | Status: DC
  Administered 2024-08-17 – 2024-08-21 (×4): 1000 ug via INTRAMUSCULAR
  Filled 2024-08-17 (×5): qty 1

## 2024-08-17 MED ORDER — FOLIC ACID 1 MG PO TABS
1.0000 mg | ORAL_TABLET | Freq: Every day | ORAL | Status: DC
  Administered 2024-08-17 – 2024-08-21 (×5): 1 mg via ORAL
  Filled 2024-08-17 (×5): qty 1

## 2024-08-17 MED ORDER — QUETIAPINE FUMARATE 25 MG PO TABS
50.0000 mg | ORAL_TABLET | Freq: Every day | ORAL | Status: DC
  Administered 2024-08-17: 50 mg via ORAL
  Filled 2024-08-17: qty 2

## 2024-08-17 MED ORDER — THIAMINE MONONITRATE 100 MG PO TABS
100.0000 mg | ORAL_TABLET | Freq: Every day | ORAL | Status: DC
  Administered 2024-08-17 – 2024-08-21 (×5): 100 mg via ORAL
  Filled 2024-08-17 (×5): qty 1

## 2024-08-17 MED ORDER — SODIUM CHLORIDE 0.9% FLUSH: 10.0000 mL | INTRAVENOUS | Status: DC | PRN

## 2024-08-17 MED ORDER — LORAZEPAM 1 MG PO TABS: 1.0000 mg | ORAL_TABLET | ORAL | Status: AC | PRN

## 2024-08-17 MED ORDER — VITAMIN C 500 MG PO TABS
500.0000 mg | ORAL_TABLET | Freq: Every day | ORAL | Status: DC
  Administered 2024-08-17 – 2024-08-21 (×5): 500 mg via ORAL
  Filled 2024-08-17 (×5): qty 1

## 2024-08-17 NOTE — Progress Notes (Signed)
 Mobility Specialist - Progress Note    08/17/24 0932  Mobility  Activity Stood at bedside;Ambulated with assistance  Level of Assistance Standby assist, set-up cues, supervision of patient - no hands on  Distance Ambulated (ft) 10 ft  Range of Motion/Exercises Active  Activity Response Tolerated fair  Mobility visit 1 Mobility  Mobility Specialist Start Time (ACUTE ONLY) [id number]  Mobility Specialist Stop Time (ACUTE ONLY) 0919  Mobility Specialist Time Calculation (min) (ACUTE ONLY) 13 min   Pt was at the EOB upon entry. Pt agreed to mobility assistance within room due to bed alarm going off. Pt ambulated to the bathroom. Pt was able to have a BM. Pt then came out the bathroom and was seen by the nurse. Pt was left at the EOB with needs in reach.  Clem Rodes Mobility Specialist 08/17/24, 9:37 AM

## 2024-08-17 NOTE — Care Management Important Message (Signed)
 Important Message  Patient Details  Name: Ana Herrera MRN: [id number] Date of Birth: November 13, 1955   Important Message Given:  Yes - Medicare IM     Rojelio SHAUNNA Rattler 08/17/2024, 4:18 PM

## 2024-08-17 NOTE — Plan of Care (Signed)

## 2024-08-17 NOTE — Progress Notes (Signed)
 Pt's bed alrm going off. Pt on her way to bathroom, found by mobility.

## 2024-08-17 NOTE — Consult Note (Signed)
 PHARMACY CONSULT NOTE   Pharmacy Consult for Electrolyte Monitoring and Replacement   Recent Labs: Potassium (mmol/L)  Date Value  08/17/2024 3.2 (L)   Magnesium  (mg/dL)  Date Value  88/85/7974 1.8   Calcium (mg/dL)  Date Value  88/85/7974 8.9   Albumin (g/dL)  Date Value  88/87/7974 3.5   Phosphorus (mg/dL)  Date Value  88/85/7974 2.7   Sodium (mmol/L)  Date Value  08/17/2024 139     Assessment: Patient is a 68 y/o female with a PMH of  HTN, stroke without deficit, gout, Etoh abuse in remission, atrial fibrillation on Coumadin, RLS, and SAH, presenting with AMS, cough and SOB. Pharmacy is asked to follow and replace electrolytes.   Goal of Therapy:  K+ 4, Mg > 2  Plan:  Kcl 40 mEq x 2 Mg 2 g IV x 1 F/u with AM labs.     Ana Herrera ,PharmD Clinical Pharmacist 08/17/2024 7:31 AM

## 2024-08-17 NOTE — Progress Notes (Signed)
 [facility] Progress Note  Patient: Ana Herrera    FMW:[id number]  DOA: 08/15/2024     Date of Service: the patient was seen and examined on 08/17/2024  Chief Complaint  Patient presents with   Dysuria   Brief hospital course:  ARLITA BUFFKIN is a 68 y.o. female with medical history significant of hypertension, stroke without deficit, gout, tobacco abuse, alcohol abuse in remission, atrial fibrillation on Coumadin, RLS, SAH, UTI, brain aneurysm with clip repair, who presents with cough, SOB, disorientation.   Per her daughter-in-law at the bedside, patient has been confused and disoriented in the past 3 days.  She has mild SOB and dry cough, no chest pain.  No fever or chills.  No nausea, vomiting, diarrhea or abdominal pain.  No symptoms of UTI.  Patient has history of dementia, but at her normal baseline she is oriented x 3.  When I saw patient in ED, she knows her only him, oriented to place, not oriented to the time.  She moves all extremities normally.  No facial droop or slurred speech.     Data reviewed independently and ED Course: pt was found to have WBC 16.5, lactic acid 3.0, INR 2.0, GFR> 60, temperature 97.5, blood pressure 120/90, heart rate of 90s- 120s, RR 30, oxygen saturation 89-94 on room air in ED.    Chest x-ray: Mild cardiomegaly with slight central congestion. Suspicion of airspace disease at the left base, possible pneumonia.     EKG: I have personally reviewed.  A-fib, heart rate 106, QTc 382, poor R wave progression.   Assessment and Plan:  # Sepsis secondary to community-acquired pneumonia, # Streptococcal pneumonia, urine strep antigen positive # Acute hypoxic respiratory failure secondary to pneumonia Continue supplemental O2 in addition gradually wean off S/p ceftriaxone  and IV fluid bolus given in the ED Lactic acid trended down, 3.0>>1.9, VS stable Continue Unasyn and azithromycin Mucinex 600 twice daily Tussionex as needed for  cough Blood culture NGTD  # Confusion and disorientation most likely due to sepsis secondary to pneumonia No focal deficits. Continue fall precautions CT head negative for any acute findings 11/14 sundowning, started Seroquel   # HTN, chronic A-fib Continue with Cardizem  and Eliquis  Monitor BP and titrate medication accordingly  # History of CVA: Continue Lipitor and Eliquis    # Hypokalemia, potassium repleted. # Hypomagnesemia, mag repleted. # Hypophosphatemia, Phos repleted. Monitor electrolytes and replete as needed.  # Iron deficiency anemia, Tsat 5%, avoided IV iron secondary to infection.  Started oral iron with vitamin C.  Follow with PCP to repeat iron profile after 3 to 6 months. Folic acid level within normal range  # Vitamin D deficiency: Vitamin D level 10.27, started vitamin D 50,000 units p.o. weekly, follow with PCP to repeat vitamin D level after 3 to 6 months.  # Vitamin B12 deficiency: B12 level 150, goal 400.  Started vitamin B12 1000 mcg IM injection daily during hospital stay, followed by oral supplement.  Follow-up PCP to repeat vitamin B12 level after 3 to 6 months.   Dementia without behavioral disturbance. - Donepezil   Body mass index is 24.34 kg/m.  Interventions:  Diet: Dysphagia 2 diet DVT Prophylaxis: Eliquis   Advance goals of care discussion: Full code  Family Communication: family was present at bedside, at the time of interview.  The pt provided permission to discuss medical plan with the family. Opportunity was given to ask question and all questions were answered satisfactorily.   Disposition:  Pt is from home,  admitted with sepsis due to pneumonia, still on IV antibiotics, which precludes a safe discharge. Discharge to home, when stable, may need few days to improve.  Subjective: Overnight patient became confused and she took off her IV line.  In the daytime she is fine, did not complain of anything so has productive cough, no any  other complaints. D/w patient's son at bedside, it seems patient is having sundowning, agreed to start Seroquel  tonight.  Physical Exam: General: NAD, lying comfortably Appear in no distress, affect appropriate Eyes: PERRLA ENT: Oral Mucosa Clear, moist  Neck: no JVD,  Cardiovascular: S1 and S2 Present, no Murmur,  Respiratory: good respiratory effort, Bilateral Air entry equal and Decreased, mild crackles b/l, no wheezes Abdomen: Bowel Sound present, Soft and no tenderness,  Skin: no rashes Extremities: no Pedal edema, no calf tenderness Neurologic: without any new focal findings Gait not checked due to patient safety concerns  Vitals:   08/17/24 0317 08/17/24 0731 08/17/24 1119 08/17/24 1556  BP: (!) 136/95 (!) 146/90 (!) 136/95 (!) 156/103  Pulse: 61 94 (!) 56 68  Resp: 17 18 19 14   Temp: 98.8 F (37.1 C) (!) 97.5 F (36.4 C) 97.6 F (36.4 C) (!) 96.8 F (36 C)  TempSrc:      SpO2: 95% 95% 95% 92%  Weight:      Height:       No intake or output data in the 24 hours ending 08/17/24 1707 Filed Weights   08/15/24 2058 08/15/24 2346  Weight: 68.4 kg 68.4 kg    Data Reviewed: I have personally reviewed and interpreted daily labs, tele strips, imagings as discussed above. I reviewed all nursing notes, pharmacy notes, vitals, pertinent old records I have discussed plan of care as described above with RN and patient/family.  CBC: Recent Labs  Lab 08/15/24 2108 08/16/24 0116 08/17/24 0332  WBC 16.5* 14.2* 11.3*  NEUTROABS 15.2*  --   --   HGB 13.4 11.1* 11.7*  HCT 41.4 34.0* 36.3  MCV 88.5 89.0 88.8  PLT 232 229 309   Basic Metabolic Panel: Recent Labs  Lab 08/15/24 2108 08/16/24 0116 08/17/24 0332  NA 134* 135 139  K 4.0 3.2* 3.2*  CL 97* 100 102  CO2 23 24 26   GLUCOSE 133* 107* 93  BUN 20 17 9   CREATININE 0.76 0.58 0.43*  CALCIUM 9.9 8.7* 8.9  MG  --  1.6* 1.8  PHOS  --  2.1* 2.7    Studies: No results found.   Scheduled Meds:  apixaban   5 mg  Oral BID   vitamin C  500 mg Oral Daily   azithromycin  500 mg Oral Daily   cyanocobalamin  1,000 mcg Intramuscular Q1200   Followed by   NOREEN ON 08/24/2024] vitamin B-12  1,000 mcg Oral Daily   diltiazem   240 mg Oral Daily   feeding supplement  237 mL Oral BID BM   folic acid  1 mg Oral Daily   guaiFENesin  600 mg Oral BID   iron polysaccharides  150 mg Oral Daily   multivitamin with minerals  1 tablet Oral Daily   potassium chloride   40 mEq Oral BID   pravastatin  40 mg Oral Daily   thiamine   100 mg Oral Daily   Or   thiamine   100 mg Intravenous Daily   Vitamin D (Ergocalciferol)  50,000 Units Oral Q7 days   Continuous Infusions:  ampicillin-sulbactam (UNASYN) IV Stopped (08/17/24 1314)   PRN Meds: acetaminophen , albuterol,  chlorpheniramine-HYDROcodone, hydrALAZINE , LORazepam  **OR** LORazepam , metoprolol  tartrate, ondansetron  (ZOFRAN ) IV, sodium chloride  flush  Time spent: 55 minutes  Author: ELVAN SOR. MD [facility] 08/17/2024 5:07 PM  To reach On-call, see care teams to locate the attending and reach out to them via [email or web address]. If 7PM-7AM, please contact night-coverage If you still have difficulty reaching the attending provider, please page the [facility] (Director on Call) for [facility] on amion for assistance.

## 2024-08-18 ENCOUNTER — Inpatient Hospital Stay

## 2024-08-18 DIAGNOSIS — J189 Pneumonia, unspecified organism: Secondary | ICD-10-CM | POA: Diagnosis not present

## 2024-08-18 LAB — CBC
HCT: 32.5 % — ABNORMAL LOW (ref 36.0–46.0)
Hemoglobin: 10.7 g/dL — ABNORMAL LOW (ref 12.0–15.0)
MCH: 28.8 pg (ref 26.0–34.0)
MCHC: 32.9 g/dL (ref 30.0–36.0)
MCV: 87.4 fL (ref 80.0–100.0)
Platelets: 316 K/uL (ref 150–400)
RBC: 3.72 MIL/uL — ABNORMAL LOW (ref 3.87–5.11)
RDW: 13.3 % (ref 11.5–15.5)
WBC: 8.8 K/uL (ref 4.0–10.5)
nRBC: 0 % (ref 0.0–0.2)

## 2024-08-18 LAB — BASIC METABOLIC PANEL WITH GFR
Anion gap: 7 (ref 5–15)
BUN: 6 mg/dL — ABNORMAL LOW (ref 8–23)
CO2: 28 mmol/L (ref 22–32)
Calcium: 8.9 mg/dL (ref 8.9–10.3)
Chloride: 106 mmol/L (ref 98–111)
Creatinine, Ser: 0.4 mg/dL — ABNORMAL LOW (ref 0.44–1.00)
GFR, Estimated: >60 mL/min (ref >60)
Glucose, Bld: 91 mg/dL (ref 70–99)
Potassium: 3.2 mmol/L — ABNORMAL LOW (ref 3.5–5.1)
Sodium: 141 mmol/L (ref 135–145)

## 2024-08-18 LAB — PHOSPHORUS: Phosphorus: 2.8 mg/dL (ref 2.5–4.6)

## 2024-08-18 LAB — MAGNESIUM: Magnesium: 2 mg/dL (ref 1.7–2.4)

## 2024-08-18 MED ORDER — POTASSIUM CHLORIDE 20 MEQ PO PACK: 40.0000 meq | PACK | ORAL | Status: DC

## 2024-08-18 MED ORDER — POTASSIUM CHLORIDE 20 MEQ PO PACK
40.0000 meq | PACK | ORAL | Status: DC
  Filled 2024-08-18: qty 2

## 2024-08-18 MED ORDER — METOPROLOL TARTRATE 5 MG/5ML IV SOLN
5.0000 mg | Freq: Once | INTRAVENOUS | Status: AC
  Administered 2024-08-18: 5 mg via INTRAVENOUS
  Filled 2024-08-18: qty 5

## 2024-08-18 MED ORDER — MEGESTROL ACETATE 400 MG/10ML PO SUSP
400.0000 mg | Freq: Every day | ORAL | Status: DC
  Administered 2024-08-18 – 2024-08-21 (×4): 400 mg via ORAL
  Filled 2024-08-18 (×4): qty 10

## 2024-08-18 MED ORDER — IRON SUCROSE 300 MG IVPB - SIMPLE MED
300.0000 mg | Freq: Once | Status: AC
  Administered 2024-08-18: 300 mg via INTRAVENOUS
  Filled 2024-08-18: qty 300

## 2024-08-18 MED ORDER — QUETIAPINE FUMARATE 25 MG PO TABS
25.0000 mg | ORAL_TABLET | Freq: Every day | ORAL | Status: DC
  Administered 2024-08-18 – 2024-08-19 (×2): 25 mg via ORAL
  Filled 2024-08-18 (×3): qty 1

## 2024-08-18 MED ORDER — QUETIAPINE FUMARATE 25 MG PO TABS: 12.5000 mg | ORAL_TABLET | Freq: Every day | ORAL | Status: DC

## 2024-08-18 MED ORDER — PANTOPRAZOLE SODIUM 40 MG PO TBEC
40.0000 mg | DELAYED_RELEASE_TABLET | Freq: Every day | ORAL | Status: DC
  Administered 2024-08-19 – 2024-08-21 (×3): 40 mg via ORAL
  Filled 2024-08-18 (×3): qty 1

## 2024-08-18 MED ORDER — POTASSIUM CHLORIDE 10 MEQ/100ML IV SOLN
10.0000 meq | INTRAVENOUS | Status: AC
  Administered 2024-08-18 (×6): 10 meq via INTRAVENOUS
  Filled 2024-08-18 (×6): qty 100

## 2024-08-18 MED ORDER — PANTOPRAZOLE SODIUM 40 MG IV SOLR
40.0000 mg | Freq: Once | INTRAVENOUS | Status: AC
  Administered 2024-08-18: 40 mg via INTRAVENOUS
  Filled 2024-08-18: qty 10

## 2024-08-18 MED ORDER — PANTOPRAZOLE SODIUM 40 MG PO TBEC: 40.0000 mg | DELAYED_RELEASE_TABLET | Freq: Every day | ORAL | Status: DC

## 2024-08-18 MED ORDER — HYDROXYZINE HCL 25 MG PO TABS
25.0000 mg | ORAL_TABLET | Freq: Two times a day (BID) | ORAL | Status: DC
  Administered 2024-08-18 – 2024-08-21 (×7): 25 mg via ORAL
  Filled 2024-08-18 (×7): qty 1

## 2024-08-18 MED ORDER — IOHEXOL 300 MG/ML  SOLN
100.0000 mL | Freq: Once | INTRAMUSCULAR | Status: AC | PRN
  Administered 2024-08-18: 100 mL via INTRAVENOUS

## 2024-08-18 NOTE — Evaluation (Signed)
 Occupational Therapy Evaluation Patient Details Name: Ana Herrera MRN: [id number] DOB: Jan 02, 1956 Today's Date: 08/18/2024   History of Present Illness   Pt is a 68 y/o female admitted secondary to cough, SOB and confusion. Pt found to have CAP. PMH including but not limited to dementia, hypertension, stroke without deficit, gout, tobacco abuse, alcohol abuse in remission, atrial fibrillation on Coumadin, RLS, SAH, UTI, brain aneurysm with clip repair.     Clinical Impressions Patient presenting with decreased Ind in self care, balance, functional mobility/transfers, endurance, and safety awareness. Patient's family present during session as pt has cognitive deficits at baseline. Family reports she lives with son and DIL in a baseline apartment they assist her as needed with IADLs but she has been able to perform most ADLs. Patient currently needing min A of 2 to stand and ambulate 10' and back in room. Pt fatigues quickly. Placed back on 1 L of O2.  Patient will benefit from acute to increase overall independence in the areas of ADLs, functional mobility, and safety awareness in order to safely discharge.     If plan is discharge home, recommend the following:   A lot of help with walking and/or transfers;A lot of help with bathing/dressing/bathroom;Assistance with cooking/housework;Assist for transportation;Help with stairs or ramp for entrance;Direct supervision/assist for financial management;Direct supervision/assist for medications management;Supervision due to cognitive status     Functional Status Assessment   Patient has had a recent decline in their functional status and demonstrates the ability to make significant improvements in function in a reasonable and predictable amount of time.     Equipment Recommendations   Other (comment) (defer to next venue of care)      Precautions/Restrictions   Precautions Precautions: Fall Recall of Precautions/Restrictions:  Impaired Restrictions Weight Bearing Restrictions Per Provider Order: No     Mobility Bed Mobility               General bed mobility comments: not assessed, pt seated in recliner chair at beginning and end of session    Transfers Overall transfer level: Needs assistance Equipment used: 2 person hand held assist Transfers: Sit to/from Stand Sit to Stand: Min assist, +2 safety/equipment, +2 physical assistance                  Balance Overall balance assessment: Needs assistance Sitting-balance support: Feet supported Sitting balance-Leahy Scale: Poor Sitting balance - Comments: pt with consistent R lateral lean   Standing balance support: Bilateral upper extremity supported Standing balance-Leahy Scale: Poor                             ADL either performed or assessed with clinical judgement      Vision Patient Visual Report: No change from baseline              Pertinent Vitals/Pain Pain Assessment Pain Assessment: No/denies pain     Extremity/Trunk Assessment Upper Extremity Assessment Upper Extremity Assessment: Generalized weakness;Difficult to assess due to impaired cognition   Lower Extremity Assessment Lower Extremity Assessment: Difficult to assess due to impaired cognition;Generalized weakness       Communication Communication Communication: No apparent difficulties   Cognition Arousal: Lethargic Behavior During Therapy: Flat affect                                 Following commands: Impaired Following commands impaired: Follows one  step commands inconsistently, Follows one step commands with increased time     Cueing  General Comments   Cueing Techniques: Verbal cues;Gestural cues;Tactile cues              Home Living Family/patient expects to be discharged to:: Private residence Living Arrangements: Other relatives Available Help at Discharge: Family;Available PRN/intermittently Type of Home:  House Home Access: Level entry     Home Layout: One level     Bathroom Shower/Tub: Producer, Television/film/video: Handicapped height     Home Equipment: Grab bars - tub/shower;Shower seat   Additional Comments: pt lives with her son and DIL and their three young children in a basement apartment at their house      Prior Functioning/Environment Prior Level of Function : Independent/Modified Independent             Mobility Comments: pt typically does not require use of an AD to ambulate ADLs Comments: per family report, pt is able to complete ADLs independently. they assist her with medications, providing food/meals and driving her.    OT Problem List: Decreased strength;Decreased safety awareness;Decreased cognition;Decreased activity tolerance;Decreased knowledge of use of DME or AE;Impaired balance (sitting and/or standing);Decreased knowledge of precautions   OT Treatment/Interventions: Self-care/ADL training;Therapeutic activities;Therapeutic exercise;Cognitive remediation/compensation;Energy conservation;Patient/family education;Balance training      OT Goals(Current goals can be found in the care plan section)   Acute Rehab OT Goals Patient Stated Goal: to go rehab OT Goal Formulation: With patient/family Time For Goal Achievement: 09/01/24 Potential to Achieve Goals: Fair ADL Goals Pt Will Perform Grooming: with supervision;standing Pt Will Perform Lower Body Dressing: with supervision;sit to/from stand Pt Will Transfer to Toilet: with supervision;ambulating Pt Will Perform Toileting - Clothing Manipulation and hygiene: with supervision;sit to/from stand   OT Frequency:  Min 2X/week       AM-PAC OT 6 Clicks Daily Activity     Outcome Measure Help from another person eating meals?: None Help from another person taking care of personal grooming?: None Help from another person toileting, which includes using toliet, bedpan, or urinal?: A Little Help  from another person bathing (including washing, rinsing, drying)?: A Little Help from another person to put on and taking off regular upper body clothing?: None Help from another person to put on and taking off regular lower body clothing?: A Little 6 Click Score: 21   End of Session Nurse Communication: Mobility status  Activity Tolerance: Patient limited by fatigue Patient left: in chair;with call bell/phone within reach;with chair alarm set;with family/visitor present;with nursing/sitter in room  OT Visit Diagnosis: Unsteadiness on feet (R26.81);Repeated falls (R29.6);Muscle weakness (generalized) (M62.81)                Time: 8873-8862 OT Time Calculation (min): 11 min Charges:  OT General Charges $OT Visit: 1 Visit OT Evaluation $OT Eval Moderate Complexity: [address] [place], OTR/L , [facility] ascom [phone]  08/18/24, 2:23 PM
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
## 2024-08-18 NOTE — Plan of Care (Signed)

## 2024-08-18 NOTE — Progress Notes (Signed)
 [facility] Progress Note  Patient: Ana Herrera    FMW:[id number]  DOA: 08/15/2024     Date of Service: the patient was seen and examined on 08/18/2024  Chief Complaint  Patient presents with   Dysuria   Brief hospital course:  Ana Herrera is a 68 y.o. female with medical history significant of hypertension, stroke without deficit, gout, tobacco abuse, alcohol abuse in remission, atrial fibrillation on Coumadin, RLS, SAH, UTI, brain aneurysm with clip repair, who presents with cough, SOB, disorientation.   Per her daughter-in-law at the bedside, patient has been confused and disoriented in the past 3 days.  She has mild SOB and dry cough, no chest pain.  No fever or chills.  No nausea, vomiting, diarrhea or abdominal pain.  No symptoms of UTI.  Patient has history of dementia, but at her normal baseline she is oriented x 3.  When I saw patient in ED, she knows her only him, oriented to place, not oriented to the time.  She moves all extremities normally.  No facial droop or slurred speech.     Data reviewed independently and ED Course: pt was found to have WBC 16.5, lactic acid 3.0, INR 2.0, GFR> 60, temperature 97.5, blood pressure 120/90, heart rate of 90s- 120s, RR 30, oxygen saturation 89-94 on room air in ED.    Chest x-ray: Mild cardiomegaly with slight central congestion. Suspicion of airspace disease at the left base, possible pneumonia.     EKG: I have personally reviewed.  A-fib, heart rate 106, QTc 382, poor R wave progression.   Assessment and Plan:  # Sepsis secondary to community-acquired pneumonia, # Streptococcal pneumonia, urine strep antigen positive # Acute hypoxic respiratory failure secondary to pneumonia Continue supplemental O2 in addition gradually wean off S/p ceftriaxone  and IV fluid bolus given in the ED Lactic acid trended down, 3.0>>1.9, VS stable Continue Unasyn and azithromycin Mucinex 600 twice daily Tussionex as needed for  cough Blood culture NGTD Continue incentive spirometry and flutter valve 11/15 CT chest: Bilateral multifocal pneumonia with mild pleural effusion   # Confusion and disorientation most likely due to sepsis secondary to pneumonia No focal deficits. Continue fall precautions CT head negative for any acute findings 11/14 sundowning, started Seroquel  50 mg, decreased to 25 mg due to drowsiness on 11/15  # HTN, chronic A-fib Continue with Cardizem  and Eliquis  Monitor BP and titrate medication accordingly 11/15 tachy due to resp failure, Lopressor  5 mg IV x 1 dose given  # History of CVA: Continue Lipitor and Eliquis    # Hypokalemia, potassium repleted. # Hypomagnesemia, mag repleted. # Hypophosphatemia, Phos repleted. Monitor electrolytes and replete as needed.  # Iron deficiency anemia, Tsat 5%, Started oral iron with vitamin C.   11/15 Venofer 300 mg IV x 1 dose given Follow with PCP to repeat iron profile after 3 to 6 months. Folic acid level within normal range  # Vitamin D deficiency: Vitamin D level 10.27, started vitamin D 50,000 units p.o. weekly, follow with PCP to repeat vitamin D level after 3 to 6 months.  # Vitamin B12 deficiency: B12 level <150, goal 400.  Started vitamin B12 1000 mcg IM injection daily during hospital stay, followed by oral supplement.  Follow-up PCP to repeat vitamin B12 level after 3 to 6 months.  # Anxiety, started Atarax 25 mg p.o. twice daily on 11/15 # Anorexia, started Megace on 11/15 # Dementia without behavioral disturbance. - Donepezil    Body mass index is 24.34 kg/m.  Interventions:  Diet: Dysphagia 2 diet DVT Prophylaxis: Eliquis   Advance goals of care discussion: Full code  Family Communication: family was present at bedside, at the time of interview.  The pt provided permission to discuss medical plan with the family. Opportunity was given to ask question and all questions were answered satisfactorily.   Disposition:  Pt is  from home, admitted with sepsis due to pneumonia, still on IV antibiotics, which precludes a safe discharge. Discharge to home, when stable, may need few days to improve.  Subjective: No significant overnight events, in the morning time patient was confused and having anxiety so she was given Ativan , more sleepy today.  Did not offer any complaints, resting comfortably Family was at bedside, management plan discussed   Physical Exam: General: NAD, lying comfortably Appear in no distress, affect appropriate Eyes: PERRLA ENT: Oral Mucosa Clear, moist  Neck: no JVD,  Cardiovascular: S1 and S2 Present, no Murmur,  Respiratory: good respiratory effort, Bilateral Air entry equal and Decreased, mild crackles b/l, no wheezes Abdomen: Bowel Sound present, Soft and no tenderness,  Skin: no rashes Extremities: no Pedal edema, no calf tenderness Neurologic: without any new focal findings Gait not checked due to patient safety concerns  Vitals:   08/18/24 1125 08/18/24 1202 08/18/24 1356 08/18/24 1555  BP: (!) 137/91 (!) 152/85  (!) 159/110  Pulse:  74  89  Resp:  14 (!) 21 14  Temp:  98.1 F (36.7 C)  99 F (37.2 C)  TempSrc:      SpO2:  91%  96%  Weight:      Height:        Intake/Output Summary (Last 24 hours) at 08/18/2024 1710 Last data filed at 08/18/2024 1046 Gross per 24 hour  Intake 200 ml  Output --  Net 200 ml   Filed Weights   08/15/24 2058 08/15/24 2346  Weight: 68.4 kg 68.4 kg    Data Reviewed: I have personally reviewed and interpreted daily labs, tele strips, imagings as discussed above. I reviewed all nursing notes, pharmacy notes, vitals, pertinent old records I have discussed plan of care as described above with RN and patient/family.  CBC: Recent Labs  Lab 08/15/24 2108 08/16/24 0116 08/17/24 0332 08/18/24 0537  WBC 16.5* 14.2* 11.3* 8.8  NEUTROABS 15.2*  --   --   --   HGB 13.4 11.1* 11.7* 10.7*  HCT 41.4 34.0* 36.3 32.5*  MCV 88.5 89.0 88.8 87.4   PLT 232 229 309 316   Basic Metabolic Panel: Recent Labs  Lab 08/15/24 2108 08/16/24 0116 08/17/24 0332 08/18/24 0537  NA 134* 135 139 141  K 4.0 3.2* 3.2* 3.2*  CL 97* 100 102 106  CO2 23 24 26 28   GLUCOSE 133* 107* 93 91  BUN 20 17 9  6*  CREATININE 0.76 0.58 0.43* 0.40*  CALCIUM 9.9 8.7* 8.9 8.9  MG  --  1.6* 1.8 2.0  PHOS  --  2.1* 2.7 2.8    Studies: No results found.   Scheduled Meds:  apixaban   5 mg Oral BID   vitamin C  500 mg Oral Daily   azithromycin  500 mg Oral Daily   cyanocobalamin  1,000 mcg Intramuscular Q1200   Followed by   NOREEN ON 08/24/2024] vitamin B-12  1,000 mcg Oral Daily   diltiazem   240 mg Oral Daily   feeding supplement  237 mL Oral BID BM   folic acid  1 mg Oral Daily   guaiFENesin  600  mg Oral BID   hydrOXYzine  25 mg Oral BID   iron polysaccharides  150 mg Oral Daily   megestrol  400 mg Oral Daily   multivitamin with minerals  1 tablet Oral Daily   [START ON 08/19/2024] pantoprazole   40 mg Oral Daily   pravastatin  40 mg Oral Daily   QUEtiapine   25 mg Oral QPC supper   thiamine   100 mg Oral Daily   Or   thiamine   100 mg Intravenous Daily   Vitamin D (Ergocalciferol)  50,000 Units Oral Q7 days   Continuous Infusions:  ampicillin-sulbactam (UNASYN) IV Stopped (08/18/24 1230)   PRN Meds: acetaminophen , albuterol, chlorpheniramine-HYDROcodone, hydrALAZINE , LORazepam  **OR** LORazepam , metoprolol  tartrate, ondansetron  (ZOFRAN ) IV, sodium chloride  flush  Time spent: 55 minutes  Author: ELVAN Herrera. MD [facility] 08/18/2024 5:10 PM  To reach On-call, see care teams to locate the attending and reach out to them via [email or web address]. If 7PM-7AM, please contact night-coverage If you still have difficulty reaching the attending provider, please page the [facility] (Director on Call) for [facility] on amion for assistance.

## 2024-08-18 NOTE — Consult Note (Signed)
 PHARMACY CONSULT NOTE   Pharmacy Consult for Electrolyte Monitoring and Replacement   Recent Labs: Potassium (mmol/L)  Date Value  08/18/2024 3.2 (L)   Magnesium  (mg/dL)  Date Value  88/84/7974 2.0   Calcium (mg/dL)  Date Value  88/84/7974 8.9   Albumin (g/dL)  Date Value  88/87/7974 3.5   Phosphorus (mg/dL)  Date Value  88/84/7974 2.8   Sodium (mmol/L)  Date Value  08/18/2024 141     Assessment: Patient is a 68 y/o female with a PMH of  HTN, stroke without deficit, gout, Etoh abuse in remission, atrial fibrillation on Coumadin, RLS, and SAH, presenting with AMS, cough and SOB. Pharmacy is asked to follow and replace electrolytes.   Goal of Therapy:  K+ 4, Mg > 2  Plan:  Will order KCL 40 mEq x 2 doses Plan to recheck electrolytes with AM labs   Estill [name], PharmD, BCPS Clinical Pharmacist 08/18/2024 7:25 AM

## 2024-08-18 NOTE — Progress Notes (Signed)
 Pt not wanting to drink po contrast. So at bedside encouraging pt with no luck

## 2024-08-19 DIAGNOSIS — J189 Pneumonia, unspecified organism: Secondary | ICD-10-CM | POA: Diagnosis not present

## 2024-08-19 DIAGNOSIS — I10 Essential (primary) hypertension: Secondary | ICD-10-CM | POA: Diagnosis not present

## 2024-08-19 DIAGNOSIS — A419 Sepsis, unspecified organism: Secondary | ICD-10-CM | POA: Diagnosis not present

## 2024-08-19 DIAGNOSIS — R410 Disorientation, unspecified: Secondary | ICD-10-CM | POA: Diagnosis not present

## 2024-08-19 LAB — BASIC METABOLIC PANEL WITH GFR
Anion gap: 9 (ref 5–15)
BUN: <5 mg/dL — ABNORMAL LOW (ref 8–23)
CO2: 28 mmol/L (ref 22–32)
Calcium: 9.3 mg/dL (ref 8.9–10.3)
Chloride: 104 mmol/L (ref 98–111)
Creatinine, Ser: 0.46 mg/dL (ref 0.44–1.00)
GFR, Estimated: >60 mL/min (ref >60)
Glucose, Bld: 82 mg/dL (ref 70–99)
Potassium: 3.8 mmol/L (ref 3.5–5.1)
Sodium: 141 mmol/L (ref 135–145)

## 2024-08-19 LAB — CBC
HCT: 36 % (ref 36.0–46.0)
Hemoglobin: 11.8 g/dL — ABNORMAL LOW (ref 12.0–15.0)
MCH: 28.8 pg (ref 26.0–34.0)
MCHC: 32.8 g/dL (ref 30.0–36.0)
MCV: 87.8 fL (ref 80.0–100.0)
Platelets: 401 K/uL — ABNORMAL HIGH (ref 150–400)
RBC: 4.1 MIL/uL (ref 3.87–5.11)
RDW: 13.4 % (ref 11.5–15.5)
WBC: 9.8 K/uL (ref 4.0–10.5)
nRBC: 0 % (ref 0.0–0.2)

## 2024-08-19 LAB — MAGNESIUM: Magnesium: 1.8 mg/dL (ref 1.7–2.4)

## 2024-08-19 LAB — PHOSPHORUS: Phosphorus: 2 mg/dL — ABNORMAL LOW (ref 2.5–4.6)

## 2024-08-19 MED ORDER — MEMANTINE HCL 10 MG PO TABS
10.0000 mg | ORAL_TABLET | Freq: Every day | ORAL | Status: DC
  Administered 2024-08-19 – 2024-08-21 (×3): 10 mg via ORAL
  Filled 2024-08-19 (×3): qty 1

## 2024-08-19 MED ORDER — DILTIAZEM HCL ER COATED BEADS 180 MG PO CP24
300.0000 mg | ORAL_CAPSULE | Freq: Every day | ORAL | Status: DC
  Administered 2024-08-20 – 2024-08-21 (×2): 300 mg via ORAL
  Filled 2024-08-19 (×2): qty 1

## 2024-08-19 MED ORDER — MAGNESIUM SULFATE IN D5W 1-5 GM/100ML-% IV SOLN
1.0000 g | Freq: Once | INTRAVENOUS | Status: AC
  Administered 2024-08-19: 1 g via INTRAVENOUS
  Filled 2024-08-19: qty 100

## 2024-08-19 MED ORDER — CITALOPRAM HYDROBROMIDE 20 MG PO TABS
10.0000 mg | ORAL_TABLET | Freq: Every day | ORAL | Status: DC
  Administered 2024-08-19 – 2024-08-21 (×3): 10 mg via ORAL
  Filled 2024-08-19 (×4): qty 1

## 2024-08-19 MED ORDER — POTASSIUM CHLORIDE 20 MEQ PO PACK
20.0000 meq | PACK | Freq: Once | ORAL | Status: AC
  Administered 2024-08-19: 20 meq via ORAL
  Filled 2024-08-19: qty 1

## 2024-08-19 MED ORDER — MIRTAZAPINE 15 MG PO TABS
7.5000 mg | ORAL_TABLET | Freq: Every day | ORAL | Status: DC
  Administered 2024-08-19 – 2024-08-20 (×2): 7.5 mg via ORAL
  Filled 2024-08-19 (×2): qty 1

## 2024-08-19 MED ORDER — K PHOS MONO-SOD PHOS DI & MONO 155-852-130 MG PO TABS
500.0000 mg | ORAL_TABLET | ORAL | Status: AC
  Administered 2024-08-19 (×2): 500 mg via ORAL
  Filled 2024-08-19 (×2): qty 2

## 2024-08-19 NOTE — Plan of Care (Signed)

## 2024-08-19 NOTE — Progress Notes (Signed)
 [facility] Progress Note  Patient: Ana Herrera    FMW:[id number]  DOA: 08/15/2024     Date of Service: the patient was seen and examined on 08/19/2024  Chief Complaint  Patient presents with   Dysuria   Brief hospital course:  [name] is a 68 y.o. female with medical history significant of hypertension, stroke without deficit, gout, tobacco abuse, alcohol abuse in remission, atrial fibrillation on Coumadin, RLS, SAH, UTI, brain aneurysm with clip repair, who presents with cough, SOB, disorientation.   Per her daughter-in-law at the bedside, patient has been confused and disoriented in the past 3 days.  She has mild SOB and dry cough, no chest pain.  No fever or chills.  No nausea, vomiting, diarrhea or abdominal pain.  No symptoms of UTI.  Patient has history of dementia, but at her normal baseline she is oriented x 3.  When I saw patient in ED, she knows her only him, oriented to place, not oriented to the time.  She moves all extremities normally.  No facial droop or slurred speech.     Data reviewed independently and ED Course: pt was found to have WBC 16.5, lactic acid 3.0, INR 2.0, GFR> 60, temperature 97.5, blood pressure 120/90, heart rate of 90s- 120s, RR 30, oxygen saturation 89-94 on room air in ED.    Chest x-ray: Mild cardiomegaly with slight central congestion. Suspicion of airspace disease at the left base, possible pneumonia.    EKG:  A-fib, heart rate 106, QTc 382, poor R wave progression.  11/16: Blood pressure mildly elevated, heart rate increases with minor exertion, increasing the home Cardizem  dose to 300 mg daily.  Mild hypophosphatemia which was repleted . Assessment and Plan:  # Sepsis secondary to community-acquired pneumonia, # Streptococcal pneumonia, urine strep antigen positive # Acute hypoxic respiratory failure secondary to pneumonia Continue supplemental O2 in addition gradually wean off S/p ceftriaxone  and IV fluid bolus given  in the ED Lactic acid trended down, 3.0>>1.9, VS stable Continue Unasyn and azithromycin Mucinex 600 twice daily Tussionex as needed for cough Blood culture NGTD Continue incentive spirometry and flutter valve 11/15 CT chest: Bilateral multifocal pneumonia with mild pleural effusion  # Confusion and disorientation most likely due to sepsis secondary to pneumonia No focal deficits. Continue fall precautions CT head negative for any acute findings 11/14 sundowning, started Seroquel  50 mg, decreased to 25 mg due to drowsiness on 11/15 11/16.  Mentation improved.  # HTN, chronic A-fib Blood pressure mildly elevated and heart rate increases with minor exertion Increasing the home dose of Cardizem  to 300 mg daily Continue with  Eliquis   # History of CVA: Continue Lipitor and Eliquis   # Hypokalemia, resolved # Hypomagnesemia, mag repleted. # Hypophosphatemia, Phos repleted. Monitor electrolytes and replete as needed.  # Iron deficiency anemia, Tsat 5%, Started oral iron with vitamin C.   11/15 Venofer 300 mg IV x 1 dose given Follow with PCP to repeat iron profile after 3 to 6 months. Folic acid level within normal range  # Vitamin D deficiency: Vitamin D level 10.27, started vitamin D 50,000 units p.o. weekly, follow with PCP to repeat vitamin D level after 3 to 6 months.  # Vitamin B12 deficiency: B12 level <150, goal 400.  Started vitamin B12 1000 mcg IM injection daily during hospital stay, followed by oral supplement.  Follow-up PCP to repeat vitamin B12 level after 3 to 6 months.  # Anxiety, started Atarax 25 mg p.o. twice daily on  11/15 # Anorexia, started Megace on 11/15 # Dementia without behavioral disturbance. - Donepezil    Body mass index is 24.34 kg/m.  Interventions:  Diet: Dysphagia 2 diet DVT Prophylaxis: Eliquis   Advance goals of care discussion: Full code  Family Communication: Discussed with grandson at bedside.  Disposition:  Pt is from home,  admitted with sepsis due to pneumonia, still on IV antibiotics, which precludes a safe discharge. Discharge to home, when stable, may need few days to improve.  Subjective: Patient was sitting comfortably in chair when seen today.  No new concern.  Physical Exam: Ready now  Vitals:   08/19/24 0439 08/19/24 0742 08/19/24 1109 08/19/24 1204  BP: (!) 155/108 (!) 167/88 (!) 156/88   Pulse: 86 96 99   Resp: 20 14    Temp: 97.9 F (36.6 C) 97.8 F (36.6 C) 97.7 F (36.5 C)   TempSrc:  Oral Oral   SpO2: 96% 93% 96% (S) 95%  Weight:      Height:       General.  Well-developed lady, in no acute distress. Pulmonary.  Lungs clear bilaterally, normal respiratory effort. CV.  Irregularly irregular Abdomen.  Soft, nontender, nondistended, BS positive. CNS.  Alert and oriented .  No focal neurologic deficit. Extremities.  No edema, no cyanosis, pulses intact and symmetrical. Psychiatry.  Judgment and insight appears normal.    Intake/Output Summary (Last 24 hours) at 08/19/2024 1422 Last data filed at 08/19/2024 1347 Gross per 24 hour  Intake 60 ml  Output 750 ml  Net -690 ml   Filed Weights   08/15/24 2058 08/15/24 2346  Weight: 68.4 kg 68.4 kg    Data Reviewed: I have personally reviewed and interpreted daily labs, tele strips, imagings as discussed above. I reviewed all nursing notes, pharmacy notes, vitals, pertinent old records I have discussed plan of care as described above with RN and patient/family.  CBC: Recent Labs  Lab 08/15/24 2108 08/16/24 0116 08/17/24 0332 08/18/24 0537 08/19/24 0459  WBC 16.5* 14.2* 11.3* 8.8 9.8  NEUTROABS 15.2*  --   --   --   --   HGB 13.4 11.1* 11.7* 10.7* 11.8*  HCT 41.4 34.0* 36.3 32.5* 36.0  MCV 88.5 89.0 88.8 87.4 87.8  PLT 232 229 309 316 401*   Basic Metabolic Panel: Recent Labs  Lab 08/15/24 2108 08/16/24 0116 08/17/24 0332 08/18/24 0537 08/19/24 0459  NA 134* 135 139 141 141  K 4.0 3.2* 3.2* 3.2* 3.8  CL 97* 100  102 106 104  CO2 23 24 26 28 28   GLUCOSE 133* 107* 93 91 82  BUN 20 17 9  6* <5*  CREATININE 0.76 0.58 0.43* 0.40* 0.46  CALCIUM 9.9 8.7* 8.9 8.9 9.3  MG  --  1.6* 1.8 2.0 1.8  PHOS  --  2.1* 2.7 2.8 2.0*    Studies: CT CHEST ABDOMEN PELVIS W CONTRAST Result Date: 08/18/2024 CLINICAL DATA:  Sepsis, pneumonia, abdominal pain, and dysuria. EXAM: CT CHEST, ABDOMEN, AND PELVIS WITH CONTRAST TECHNIQUE: Multidetector CT imaging of the chest, abdomen and pelvis was performed following the standard protocol during bolus administration of intravenous contrast. RADIATION DOSE REDUCTION: This exam was performed according to the departmental dose-optimization program which includes automated exposure control, adjustment of the mA and/or kV according to patient size and/or use of iterative reconstruction technique. CONTRAST:  OMNIPAQUE  IOHEXOL  300 MG/ML  SOLN COMPARISON:  02/29/2024. FINDINGS: CT CHEST FINDINGS Cardiovascular: The heart is enlarged and there is a small pericardial effusion. Multi-vessel coronary  artery calcifications are noted. There is atherosclerotic calcification of the aorta without evidence of aneurysm. The pulmonary trunk is mildly distended suggesting underlying pulmonary artery hypertension. Mediastinum/Nodes: Prominent lymph nodes are present in the mediastinum measuring up to 1.8 cm in the pretracheal space. No hilar or axillary lymphadenopathy is seen. The right lobe of the thyroid gland is enlarged. The trachea and esophagus are within normal limits. Lungs/Pleura: There small bilateral pleural effusions with consolidation at the lung bases bilaterally. Emphysematous changes are noted in the lungs. Scattered airspace disease is noted in the lungs bilaterally. Musculoskeletal: Degenerative changes are present in the thoracic spine. No acute osseous abnormality. CT ABDOMEN PELVIS FINDINGS Hepatobiliary: No focal liver abnormality is seen. No gallstones, gallbladder wall thickening, or  biliary dilatation. Pancreas: Unremarkable. No pancreatic ductal dilatation or surrounding inflammatory changes. Spleen: Normal in size without focal abnormality. Adrenals/Urinary Tract: The adrenal glands are within normal limits. The kidneys enhance symmetrically. No renal calculus or hydronephrosis bilaterally. The bladder is unremarkable. Stomach/Bowel: The stomach is within normal limits. No bowel obstruction, free air, or pneumatosis is seen. Appendicoliths are noted without evidence of acute appendicitis. Vascular/Lymphatic: Aortic atherosclerosis. No enlarged abdominal or pelvic lymph nodes. Reproductive: Uterus and bilateral adnexa are unremarkable. Other: No abdominopelvic ascites. Musculoskeletal: Degenerative changes are present in the lumbar spine. No acute osseous abnormality. IMPRESSION: 1. Scattered airspace opacities in the lungs bilaterally with bilateral lower lobe consolidation, suggesting multifocal pneumonia. 2. Small bilateral pleural effusions. 3. No acute abnormality in the abdomen and pelvis. 4. Aortic atherosclerosis and coronary artery calcifications. Electronically Signed   By: Leita Birmingham M.D.   On: 08/18/2024 17:10     Scheduled Meds:  apixaban   5 mg Oral BID   vitamin C  500 mg Oral Daily   azithromycin  500 mg Oral Daily   cyanocobalamin  1,000 mcg Intramuscular Q1200   Followed by   NOREEN ON 08/24/2024] vitamin B-12  1,000 mcg Oral Daily   diltiazem   240 mg Oral Daily   feeding supplement  237 mL Oral BID BM   folic acid  1 mg Oral Daily   guaiFENesin  600 mg Oral BID   hydrOXYzine  25 mg Oral BID   iron polysaccharides  150 mg Oral Daily   megestrol  400 mg Oral Daily   multivitamin with minerals  1 tablet Oral Daily   pantoprazole   40 mg Oral Daily   pravastatin  40 mg Oral Daily   QUEtiapine   25 mg Oral QPC supper   thiamine   100 mg Oral Daily   Or   thiamine   100 mg Intravenous Daily   Vitamin D (Ergocalciferol)  50,000 Units Oral Q7 days    Continuous Infusions:  ampicillin-sulbactam (UNASYN) IV 3 g (08/19/24 1239)   PRN Meds: acetaminophen , albuterol, chlorpheniramine-HYDROcodone, hydrALAZINE , LORazepam  **OR** LORazepam , metoprolol  tartrate, ondansetron  (ZOFRAN ) IV, sodium chloride  flush  Time spent: 50 minutes  Author: Amaryllis Dare. MD [facility] 08/19/2024 2:22 PM  To reach On-call, see care teams to locate the attending and reach out to them via [email or web address]. If 7PM-7AM, please contact night-coverage If you still have difficulty reaching the attending provider, please page the [facility] (Director on Call) for [facility] on amion for assistance.

## 2024-08-19 NOTE — Plan of Care (Signed)
   Problem: Education: Goal: Knowledge of General Education information will improve Description: Including pain rating scale, medication(s)/side effects and non-pharmacologic comfort measures Outcome: Progressing   Problem: Clinical Measurements: Goal: Ability to maintain clinical measurements within normal limits will improve Outcome: Progressing Goal: Diagnostic test results will improve Outcome: Progressing

## 2024-08-19 NOTE — TOC Initial Note (Signed)
 Transition of Care [facility]) - Initial/Assessment Note    Patient Details  Name: Ana Herrera MRN: [id number] Date of Birth: 1956/03/31  Transition of Care [facility]) CM/SW Contact:    Victory Jackquline RAMAN, RN Phone Number: 08/19/2024, 5:42 PM  Clinical Narrative:    RNCM met with patient and grandson at bedside. RNCM Introduced role and explained that discharge planning would be discussed. PT is recommending STR. Patient asked that I speak to her son about STR, she lives with him and he handles everything for her. Patient states that she doesn't have any DME at home. RNCM will continue to follow for discharge planning needs.   Florence Rogue at [phone] left a message for him to call me back. Awaiting call back.       Expected Discharge Plan: Skilled Nursing Facility Barriers to Discharge: Continued Medical Work up   Patient Goals and CMS Choice            Expected Discharge Plan and Services       Living arrangements for the past 2 months: Single Family Home                                      Prior Living Arrangements/Services Living arrangements for the past 2 months: Single Family Home Lives with:: Self, Adult Children Patient language and need for interpreter reviewed:: Yes Do you feel safe going back to the place where you live?: Yes      Need for Family Participation in Patient Care: Yes (Comment) Care giver support system in place?: Yes (comment)   Criminal Activity/Legal Involvement Pertinent to Current Situation/Hospitalization: No - Comment as needed  Activities of Daily Living   ADL Screening (condition at time of admission) Independently performs ADLs?: Yes (appropriate for developmental age) Is the patient deaf or have difficulty hearing?: No Does the patient have difficulty seeing, even when wearing glasses/contacts?: Yes Does the patient have difficulty concentrating, remembering, or making decisions?: Yes  Permission Sought/Granted                   Emotional Assessment Appearance:: Appears stated age, Well-Groomed Attitude/Demeanor/Rapport: Gracious Affect (typically observed): Calm, Quiet, Pleasant Orientation: : Oriented to Self, Oriented to Place, Oriented to  Time, Oriented to Situation Alcohol / Substance Use: Not Applicable Psych Involvement: No (comment)  Admission diagnosis:  Confusion [R41.0] CAP (community acquired pneumonia) [J18.9] Pneumonia due to infectious organism, unspecified laterality, unspecified part of lung [J18.9] Patient Active Problem List   Diagnosis Date Noted   CAP (community acquired pneumonia) 08/15/2024   Sepsis (HCC) 08/15/2024   Dementia without behavioral disturbance (HCC) 08/15/2024   Malnutrition of moderate degree 05/23/2023   AMS (altered mental status) 05/20/2023   Confusion and disorientation 05/19/2023   Encounter for removal of biliary stent 02/01/2023   Cholangitis (HCC) 11/29/2022   Tobacco abuse 11/29/2022   Hyponatremia 11/29/2022   Elevated alkaline phosphatase level 11/29/2022   Leukocytosis 11/29/2022   Nodule of middle lobe of right lung 11/29/2022   Jaundice 11/29/2022   Abdominal pain 11/28/2022   HTN (hypertension) 11/28/2022   Stroke (HCC) 11/28/2022   Gout 11/28/2022   Abnormal LFTs 11/28/2022   AKI (acute kidney injury) 11/28/2022   Atrial fibrillation, chronic (HCC) 11/28/2022   Supratherapeutic INR 11/28/2022   Choledocholithiasis 11/28/2022   Microcytic anemia 11/28/2022   Hypokalemia 11/28/2022   Hypomagnesemia 11/28/2022   PCP:  Eliverto Bette Hover, MD  Pharmacy:   [facility] - [place], [place] - [address] [place] [address] Phone: [phone] Fax: [phone]  [facility] - [facility] [address] [place] [address] Phone: [phone] Fax: [phone]     Social Drivers of Health (SDOH) Social History: SDOH Screenings   Food Insecurity: No Food Insecurity  (08/16/2024)  Housing: Unknown (08/16/2024)  Transportation Needs: No Transportation Needs (08/16/2024)  Utilities: Not At Risk (08/16/2024)  Social Connections: Socially Isolated (08/16/2024)  Tobacco Use: Medium Risk (07/16/2024)   Received from [facility]   SDOH Interventions:     Readmission Risk Interventions     No data to display

## 2024-08-19 NOTE — Consult Note (Addendum)
 PHARMACY CONSULT NOTE   Pharmacy Consult for Electrolyte Monitoring and Replacement   Recent Labs: Potassium (mmol/L)  Date Value  08/19/2024 3.8   Magnesium  (mg/dL)  Date Value  88/83/7974 1.8   Calcium (mg/dL)  Date Value  88/83/7974 9.3   Albumin (g/dL)  Date Value  88/87/7974 3.5   Phosphorus (mg/dL)  Date Value  88/83/7974 2.0 (L)   Sodium (mmol/L)  Date Value  08/19/2024 141     Assessment: Patient is a 68 y/o female with a PMH of  HTN, stroke without deficit, gout, Etoh abuse in remission, atrial fibrillation on Coumadin, RLS, and SAH, presenting with AMS, cough and SOB. Pharmacy is asked to follow and replace electrolytes.   Goal of Therapy:  K+ 4, Mg > 2  Plan:  Mag: 1.8, K: 3.8, Phos 2.0 Order Magnesium  1g IV   Order KPhos Neutral 2 tablets every 4 hours x2 doses Order KCl 20mEq x 1 dose PO Plan to recheck electrolytes with AM labs   Ana Herrera, PharmD, BCPS Clinical Pharmacist 08/19/2024 6:41 AM

## 2024-08-20 DIAGNOSIS — I10 Essential (primary) hypertension: Secondary | ICD-10-CM | POA: Diagnosis not present

## 2024-08-20 DIAGNOSIS — A419 Sepsis, unspecified organism: Secondary | ICD-10-CM | POA: Diagnosis not present

## 2024-08-20 DIAGNOSIS — J189 Pneumonia, unspecified organism: Secondary | ICD-10-CM | POA: Diagnosis not present

## 2024-08-20 DIAGNOSIS — R410 Disorientation, unspecified: Secondary | ICD-10-CM | POA: Diagnosis not present

## 2024-08-20 LAB — CBC
HCT: 35.1 % — ABNORMAL LOW (ref 36.0–46.0)
Hemoglobin: 11.4 g/dL — ABNORMAL LOW (ref 12.0–15.0)
MCH: 28.6 pg (ref 26.0–34.0)
MCHC: 32.5 g/dL (ref 30.0–36.0)
MCV: 88.2 fL (ref 80.0–100.0)
Platelets: 420 K/uL — ABNORMAL HIGH (ref 150–400)
RBC: 3.98 MIL/uL (ref 3.87–5.11)
RDW: 13.5 % (ref 11.5–15.5)
WBC: 8.2 K/uL (ref 4.0–10.5)
nRBC: 0.2 % (ref 0.0–0.2)

## 2024-08-20 LAB — BASIC METABOLIC PANEL WITH GFR
Anion gap: 9 (ref 5–15)
BUN: 5 mg/dL — ABNORMAL LOW (ref 8–23)
CO2: 25 mmol/L (ref 22–32)
Calcium: 8.5 mg/dL — ABNORMAL LOW (ref 8.9–10.3)
Chloride: 106 mmol/L (ref 98–111)
Creatinine, Ser: 0.44 mg/dL (ref 0.44–1.00)
GFR, Estimated: >60 mL/min (ref >60)
Glucose, Bld: 91 mg/dL (ref 70–99)
Potassium: 3.4 mmol/L — ABNORMAL LOW (ref 3.5–5.1)
Sodium: 140 mmol/L (ref 135–145)

## 2024-08-20 LAB — CULTURE, BLOOD (ROUTINE X 2)
Culture: NO GROWTH
Culture: NO GROWTH

## 2024-08-20 LAB — PHOSPHORUS: Phosphorus: 3.2 mg/dL (ref 2.5–4.6)

## 2024-08-20 MED ORDER — ROSUVASTATIN CALCIUM 10 MG PO TABS
20.0000 mg | ORAL_TABLET | Freq: Every day | ORAL | Status: DC
  Administered 2024-08-20: 20 mg via ORAL
  Filled 2024-08-20: qty 2

## 2024-08-20 MED ORDER — POTASSIUM CHLORIDE CRYS ER 20 MEQ PO TBCR
20.0000 meq | EXTENDED_RELEASE_TABLET | Freq: Once | ORAL | Status: AC
  Administered 2024-08-20: 20 meq via ORAL
  Filled 2024-08-20: qty 1

## 2024-08-20 MED ORDER — AMOXICILLIN-POT CLAVULANATE 875-125 MG PO TABS
1.0000 | ORAL_TABLET | Freq: Two times a day (BID) | ORAL | Status: DC
  Administered 2024-08-20 – 2024-08-21 (×3): 1 via ORAL
  Filled 2024-08-20 (×3): qty 1

## 2024-08-20 MED ORDER — POTASSIUM CHLORIDE CRYS ER 20 MEQ PO TBCR
40.0000 meq | EXTENDED_RELEASE_TABLET | Freq: Once | ORAL | Status: AC
  Administered 2024-08-20: 40 meq via ORAL
  Filled 2024-08-20: qty 2

## 2024-08-20 NOTE — Progress Notes (Signed)
 Occupational Therapy Treatment Patient Details Name: Ana Herrera MRN: [id number] DOB: 03/01/1956 Today's Date: 08/20/2024   History of present illness Pt is a 68 y/o female admitted secondary to cough, SOB and confusion. Pt found to have CAP. PMH including but not limited to dementia, hypertension, stroke without deficit, gout, tobacco abuse, alcohol abuse in remission, atrial fibrillation on Coumadin, RLS, SAH, UTI, brain aneurysm with clip repair.   OT comments  Pt seen for OT and PT co-tx to optimize safety with ADL/mobility. Pt received in the recliner, pleasant and agreeable. Denies complaints. Pt required MIN A +2 for STS from recliner with VC for hand placement and RW positioning, improving to MIN A +1 from toilet with R grab bar and from standard chair without arm rests. Pt ambulated in hall with RW and CGA with +2 for O2 tank mgt. HR up to 142 and SpO2 briefly drops to 83% on 2L O2, improving with seated rest break and MAX VC for PLB. Pt and family also instructed in incentive spirometer use and flutter valve use. Pt required MAX VC for sequencing to optimize proper technique. Pt continues to benefit from skilled OT services to maximize return to PLOF.       If plan is discharge home, recommend the following:  A lot of help with walking and/or transfers;A lot of help with bathing/dressing/bathroom;Assistance with cooking/housework;Assist for transportation;Help with stairs or ramp for entrance;Direct supervision/assist for financial management;Direct supervision/assist for medications management;Supervision due to cognitive status   Equipment Recommendations  Other (comment) (defer to next venue)    Recommendations for Other Services      Precautions / Restrictions Precautions Precautions: Fall Recall of Precautions/Restrictions: Impaired Restrictions Weight Bearing Restrictions Per Provider Order: No       Mobility Bed Mobility               General bed mobility  comments: not assessed, pt seated in recliner chair at beginning and end of session    Transfers Overall transfer level: Needs assistance Equipment used: Rolling walker (2 wheels) Transfers: Sit to/from Stand Sit to Stand: Min assist, +2 safety/equipment, +2 physical assistance           General transfer comment: initial MIN A +2 from recliner, MIN A +1 from toilet wiht grab bar and from standard chair, VC each time for hand placement     Balance Overall balance assessment: Needs assistance Sitting-balance support: Feet supported Sitting balance-Leahy Scale: Fair     Standing balance support: Single extremity supported, No upper extremity supported, During functional activity, Reliant on assistive device for balance Standing balance-Leahy Scale: Fair Standing balance comment: fair static standing balance at sink to wash hands wiht intermittent UE support; poor with dynamic balance and ambulation requiring BUE support on the RW                           ADL either performed or assessed with clinical judgement   ADL Overall ADL's : Needs assistance/impaired     Grooming: Standing;Wash/dry hands;Supervision/safety;Cueing for sequencing;Brushing hair;Contact guard assist Grooming Details (indicate cue type and reason): SBA + MAX VC for initiation and sequencing                 Toilet Transfer: Grab bars;Ambulation;Regular Toilet;Rolling walker (2 wheels);Minimal assistance   Toileting- Clothing Manipulation and Hygiene: Sitting/lateral lean;Supervision/safety Toileting - Clothing Manipulation Details (indicate cue type and reason): seated pericare     Functional mobility during ADLs: Contact  guard assist;Cueing for sequencing;Rolling walker (2 wheels);Cueing for safety      Extremity/Trunk Assessment              Vision       Perception     Praxis     Communication Communication Communication: No apparent difficulties   Cognition Arousal:  Alert Behavior During Therapy: WFL for tasks assessed/performed Cognition: History of cognitive impairments             OT - Cognition Comments: Pt oriented to self, decreased insight into deficits/safety, requires MAX VC for sequencing and initiation of tasks                 Following commands: Impaired Following commands impaired: Follows one step commands inconsistently, Follows one step commands with increased time      Cueing   Cueing Techniques: Verbal cues, Gestural cues, Tactile cues  Exercises Other Exercises Other Exercises: Pt/family instructed in IS and flutter valve use. pt required near constant MAX VC for sequencing and several trials to perform correctly    Shoulder Instructions       General Comments On 2L throughout, 91% on at rest, desats to 83% briefly with mobility and HR up to 142 briefly, improving wiht VC for PLB and seated rest break to >94% SpO2 and HR <95; RN notified    Pertinent Vitals/ Pain       Pain Assessment Pain Assessment: No/denies pain  Home Living                                          Prior Functioning/Environment              Frequency  Min 2X/week        Progress Toward Goals  OT Goals(current goals can now be found in the care plan section)  Progress towards OT goals: Progressing toward goals  Acute Rehab OT Goals Patient Stated Goal: go to rehab OT Goal Formulation: With patient/family Time For Goal Achievement: 09/01/24 Potential to Achieve Goals: Fair  Plan      Co-evaluation    PT/OT/SLP Co-Evaluation/Treatment: Yes Reason for Co-Treatment: For patient/therapist safety;Necessary to address cognition/behavior during functional activity;To address functional/ADL transfers PT goals addressed during session: Mobility/safety with mobility;Balance;Proper use of DME OT goals addressed during session: ADL's and self-care;Proper use of Adaptive equipment and DME      AM-PAC OT 6  Clicks Daily Activity     Outcome Measure   Help from another person eating meals?: None Help from another person taking care of personal grooming?: A Little Help from another person toileting, which includes using toliet, bedpan, or urinal?: A Little Help from another person bathing (including washing, rinsing, drying)?: A Little Help from another person to put on and taking off regular upper body clothing?: None Help from another person to put on and taking off regular lower body clothing?: A Little 6 Click Score: 20    End of Session Equipment Utilized During Treatment: Gait belt;Oxygen;Rolling walker (2 wheels)  OT Visit Diagnosis: Unsteadiness on feet (R26.81);Repeated falls (R29.6);Muscle weakness (generalized) (M62.81)   Activity Tolerance Patient tolerated treatment well   Patient Left in chair;with call bell/phone within reach;with chair alarm set;with family/visitor present   Nurse Communication Mobility status;Other (comment) (HR, O2)        Time: 1400-1434 OT Time Calculation (min): 34 min  Charges: OT General Charges $  OT Visit: 1 Visit OT Treatments $Self Care/Home Management : 8-22 mins  Warren SAUNDERS., MPH, MS, OTR/L ascom [phone] 08/20/24, 2:49 PM

## 2024-08-20 NOTE — Progress Notes (Signed)
 [facility] Progress Note  Patient: Ana Herrera    FMW:[id number]  DOA: 08/15/2024     Date of Service: the patient was seen and examined on 08/20/2024  Chief Complaint  Patient presents with   Dysuria   Brief hospital course:  Ana Herrera is a 68 y.o. female with medical history significant of hypertension, stroke without deficit, gout, tobacco abuse, alcohol abuse in remission, atrial fibrillation on Coumadin, RLS, SAH, UTI, brain aneurysm with clip repair, who presents with cough, SOB, disorientation.   Per her daughter-in-law at the bedside, patient has been confused and disoriented in the past 3 days.  She has mild SOB and dry cough, no chest pain.  No fever or chills.  No nausea, vomiting, diarrhea or abdominal pain.  No symptoms of UTI.  Patient has history of dementia, but at her normal baseline she is oriented x 3.  When I saw patient in ED, she knows her only him, oriented to place, not oriented to the time.  She moves all extremities normally.  No facial droop or slurred speech.     Data reviewed independently and ED Course: pt was found to have WBC 16.5, lactic acid 3.0, INR 2.0, GFR> 60, temperature 97.5, blood pressure 120/90, heart rate of 90s- 120s, RR 30, oxygen saturation 89-94 on room air in ED.    Chest x-ray: Mild cardiomegaly with slight central congestion. Suspicion of airspace disease at the left base, possible pneumonia.    EKG:  A-fib, heart rate 106, QTc 382, poor R wave progression.  11/16: Blood pressure mildly elevated, heart rate increases with minor exertion, increasing the home Cardizem  dose to 300 mg daily.  Mild hypophosphatemia which was repleted.  11/17: Hemodynamically stable, Unasyn is switched with Augmentin .  Heart rate and blood pressure improved after increasing the dose of Cardizem . . Assessment and Plan:  # Sepsis secondary to community-acquired pneumonia, # Streptococcal pneumonia, urine strep antigen positive # Acute  hypoxic respiratory failure secondary to pneumonia Continue supplemental O2 in addition gradually wean off S/p ceftriaxone  and IV fluid bolus given in the ED Lactic acid trended down, 3.0>>1.9, VS stable Unasyn is being switched with Augmentin  Mucinex 600 twice daily Tussionex as needed for cough Blood culture NGTD Continue incentive spirometry and flutter valve 11/15 CT chest: Bilateral multifocal pneumonia with mild pleural effusion  # Confusion and disorientation most likely due to sepsis secondary to pneumonia No focal deficits. Continue fall precautions CT head negative for any acute findings 11/14 sundowning, started Seroquel  50 mg, decreased to 25 mg due to drowsiness on 11/15 11/16.  Mentation improved.  # HTN, chronic A-fib Blood pressure within goal and heart rate improved after increasing the dose of Cardizem  Continue increased dose of Cardizem  at 300 mg daily Continue with  Eliquis   # History of CVA: Continue Lipitor and Eliquis   # Hypokalemia, potassium of 3.4 which is being repleted # Hypomagnesemia, mag repleted. # Hypophosphatemia, Phos repleted. Monitor electrolytes and replete as needed.  # Iron deficiency anemia, Tsat 5%, Started oral iron with vitamin C.   11/15 Venofer 300 mg IV x 1 dose given Follow with PCP to repeat iron profile after 3 to 6 months. Folic acid level within normal range  # Vitamin D deficiency: Vitamin D level 10.27, started vitamin D 50,000 units p.o. weekly, follow with PCP to repeat vitamin D level after 3 to 6 months.  # Vitamin B12 deficiency: B12 level <150, goal 400.  Started vitamin B12 1000 mcg IM injection  daily during hospital stay, followed by oral supplement.  Follow-up PCP to repeat vitamin B12 level after 3 to 6 months.  # Anxiety, started Atarax 25 mg p.o. twice daily on 11/15 # Anorexia, started Megace on 11/15 # Dementia without behavioral disturbance. - Donepezil    Body mass index is 24.34 kg/m.   Interventions:  Diet: Dysphagia 2 diet DVT Prophylaxis: Eliquis   Advance goals of care discussion: Full code  Family Communication: Discussed with daughter-in-law at bedside  Disposition: SNF  Subjective: Patient was sitting comfortably in chair when seen today.  Continued to improve, still having some cough.  Physical Exam: Ready now  Vitals:   08/20/24 0505 08/20/24 0814 08/20/24 1019 08/20/24 1238  BP: 130/79 134/88  123/84  Pulse: 81 (!) 106 80 87  Resp: 18 14  16   Temp: 97.7 F (36.5 C) (!) 97.3 F (36.3 C)  97.8 F (36.6 C)  TempSrc:      SpO2: 95% 92% 93% 93%  Weight:      Height:       General.  Frail elderly lady, in no acute distress. Pulmonary.  Few scattered rhonchi bilaterally, normal respiratory effort. CV.  Regular rate and rhythm, no JVD, rub or murmur. Abdomen.  Soft, nontender, nondistended, BS positive. CNS.  Alert and oriented .  No focal neurologic deficit. Extremities.  No edema, no cyanosis, pulses intact and symmetrical. Psychiatry.  Judgment and insight appears normal.    Intake/Output Summary (Last 24 hours) at 08/20/2024 1552 Last data filed at 08/20/2024 0230 Gross per 24 hour  Intake 360 ml  Output 600 ml  Net -240 ml   Filed Weights   08/15/24 2058 08/15/24 2346  Weight: 68.4 kg 68.4 kg    Data Reviewed: I have personally reviewed and interpreted daily labs, tele strips, imagings as discussed above. I reviewed all nursing notes, pharmacy notes, vitals, pertinent old records I have discussed plan of care as described above with RN and patient/family.  CBC: Recent Labs  Lab 08/15/24 2108 08/16/24 0116 08/17/24 0332 08/18/24 0537 08/19/24 0459 08/20/24 0447  WBC 16.5* 14.2* 11.3* 8.8 9.8 8.2  NEUTROABS 15.2*  --   --   --   --   --   HGB 13.4 11.1* 11.7* 10.7* 11.8* 11.4*  HCT 41.4 34.0* 36.3 32.5* 36.0 35.1*  MCV 88.5 89.0 88.8 87.4 87.8 88.2  PLT 232 229 309 316 401* 420*   Basic Metabolic Panel: Recent Labs  Lab  08/16/24 0116 08/17/24 0332 08/18/24 0537 08/19/24 0459 08/20/24 0447  NA 135 139 141 141 140  K 3.2* 3.2* 3.2* 3.8 3.4*  CL 100 102 106 104 106  CO2 24 26 28 28 25   GLUCOSE 107* 93 91 82 91  BUN 17 9 6* <5* 5*  CREATININE 0.58 0.43* 0.40* 0.46 0.44  CALCIUM 8.7* 8.9 8.9 9.3 8.5*  MG 1.6* 1.8 2.0 1.8  --   PHOS 2.1* 2.7 2.8 2.0* 3.2    Studies: No results found.    Scheduled Meds:  amoxicillin -clavulanate  1 tablet Oral Q12H   apixaban   5 mg Oral BID   vitamin C  500 mg Oral Daily   citalopram  10 mg Oral Daily   cyanocobalamin  1,000 mcg Intramuscular Q1200   Followed by   NOREEN ON 08/24/2024] vitamin B-12  1,000 mcg Oral Daily   diltiazem  (CARDIZEM  CD) 24 hr capsule 300 mg  300 mg Oral Daily   feeding supplement  237 mL Oral BID BM  folic acid  1 mg Oral Daily   guaiFENesin  600 mg Oral BID   hydrOXYzine  25 mg Oral BID   iron polysaccharides  150 mg Oral Daily   megestrol  400 mg Oral Daily   memantine  10 mg Oral Daily   mirtazapine  7.5 mg Oral QHS   multivitamin with minerals  1 tablet Oral Daily   pantoprazole   40 mg Oral Daily   potassium chloride   20 mEq Oral Once   QUEtiapine   25 mg Oral QPC supper   rosuvastatin  20 mg Oral Daily   thiamine   100 mg Oral Daily   Or   thiamine   100 mg Intravenous Daily   Vitamin D (Ergocalciferol)  50,000 Units Oral Q7 days   Continuous Infusions:   PRN Meds: acetaminophen , albuterol, chlorpheniramine-HYDROcodone, hydrALAZINE , metoprolol  tartrate, ondansetron  (ZOFRAN ) IV, sodium chloride  flush  Time spent: 50 minutes  This record has been created using Conservation officer, historic buildings. Errors have been sought and corrected,but may not always be located. Such creation errors do not reflect on the standard of care.   Author: Amaryllis Dare. MD [facility] 08/20/2024 3:52 PM  To reach On-call, see care teams to locate the attending and reach out to them via [email or web address]. If 7PM-7AM, please contact  night-coverage If you still have difficulty reaching the attending provider, please page the [facility] (Director on Call) for [facility] on amion for assistance.

## 2024-08-20 NOTE — NC FL2 (Signed)
 [place]  MEDICAID [id number] LEVEL OF CARE FORM     IDENTIFICATION  Patient Name: Ana Herrera Birthdate: [birth year] Sex: female Admission Date (Current Location): 08/15/2024  [facility] and [place] Number:  Chiropodist and Address:  [facility], [address] [place], [place] [address]      Provider Number: [id number]  Attending Physician Name and Address:  Caleen Qualia, MD  Relative Name and Phone Number:  Katelind Pytel pt son   [phone]    Current Level of Care: SNF Recommended Level of Care: Skilled Nursing Facility Prior Approval Number:    Date Approved/Denied:   PASRR Number: [id number]  Discharge Plan:      Current Diagnoses: Patient Active Problem List   Diagnosis Date Noted   CAP (community acquired pneumonia) 08/15/2024   Sepsis (HCC) 08/15/2024   Dementia without behavioral disturbance (HCC) 08/15/2024   Malnutrition of moderate degree 05/23/2023   AMS (altered mental status) 05/20/2023   Confusion and disorientation 05/19/2023   Encounter for removal of biliary stent 02/01/2023   Cholangitis (HCC) 11/29/2022   Tobacco abuse 11/29/2022   Hyponatremia 11/29/2022   Elevated alkaline phosphatase level 11/29/2022   Leukocytosis 11/29/2022   Nodule of middle lobe of right lung 11/29/2022   Jaundice 11/29/2022   Abdominal pain 11/28/2022   HTN (hypertension) 11/28/2022   Stroke (HCC) 11/28/2022   Gout 11/28/2022   Abnormal LFTs 11/28/2022   AKI (acute kidney injury) 11/28/2022   Atrial fibrillation, chronic (HCC) 11/28/2022   Supratherapeutic INR 11/28/2022   Choledocholithiasis 11/28/2022   Microcytic anemia 11/28/2022   Hypokalemia 11/28/2022   Hypomagnesemia 11/28/2022    Orientation RESPIRATION BLADDER Height & Weight     Self, Time, Situation, Place  O2, Other (Comment) (nasal cannula 2l) Continent Weight: 150 lb 12.7 oz (68.4 kg) Height:  5' 6 (167.6 cm)  BEHAVIORAL SYMPTOMS/MOOD NEUROLOGICAL BOWEL  NUTRITION STATUS      Continent Diet (Aspiration precautions starting at 11/15 0856  Fall precautions starting at 11/12 2307  DIET DYS 2 Fluid consistency: Thin: Dysphagia starting at 11/12 2302)  AMBULATORY STATUS COMMUNICATION OF NEEDS Skin   Limited Assist   Normal                       Personal Care Assistance Level of Assistance  Bathing, Feeding Bathing Assistance: Limited assistance Feeding assistance: Limited assistance       Functional Limitations Info             SPECIAL CARE FACTORS FREQUENCY  PT (By licensed PT), OT (By licensed OT)     PT Frequency: 5x OT Frequency: 5x            Contractures Contractures Info: Not present    Additional Factors Info                  Current Medications (08/20/2024):  This is the current hospital active medication list Current Facility-Administered Medications  Medication Dose Route Frequency Provider Last Rate Last Admin   acetaminophen  (TYLENOL ) tablet 650 mg  650 mg Oral Q6H PRN Niu, Xilin, MD       albuterol (PROVENTIL) (2.5 MG/3ML) 0.083% nebulizer solution 2.5 mg  2.5 mg Nebulization Q4H PRN Niu, Xilin, MD       amoxicillin -clavulanate (AUGMENTIN ) 875-125 MG per tablet 1 tablet  1 tablet Oral Q12H Amin, Sumayya, MD       apixaban  (ELIQUIS ) tablet 5 mg  5 mg Oral BID Niu, Xilin, MD  5 mg at 08/20/24 9066   ascorbic acid (VITAMIN C) tablet 500 mg  500 mg Oral Daily Von Bellis, MD   500 mg at 08/20/24 0932   chlorpheniramine-HYDROcodone (TUSSIONEX) 10-8 MG/5ML suspension 5 mL  5 mL Oral Q12H PRN Von Bellis, MD       citalopram (CELEXA) tablet 10 mg  10 mg Oral Daily Amin, Sumayya, MD   10 mg at 08/20/24 0932   cyanocobalamin (VITAMIN B12) injection 1,000 mcg  1,000 mcg Intramuscular Q1200 Von Bellis, MD   1,000 mcg at 08/19/24 1230   Followed by   NOREEN ON 08/24/2024] cyanocobalamin (VITAMIN B12) tablet 1,000 mcg  1,000 mcg Oral Daily Von Bellis, MD       diltiazem  (CARDIZEM  CD) 24 hr capsule 300  mg  300 mg Oral Daily Amin, Sumayya, MD   300 mg at 08/20/24 0932   feeding supplement (ENSURE PLUS HIGH PROTEIN) liquid 237 mL  237 mL Oral BID BM Von Bellis, MD   237 mL at 08/20/24 0934   folic acid (FOLVITE) tablet 1 mg  1 mg Oral Daily Von Bellis, MD   1 mg at 08/20/24 0934   guaiFENesin (MUCINEX) 12 hr tablet 600 mg  600 mg Oral BID Von Bellis, MD   600 mg at 08/20/24 0933   hydrALAZINE  (APRESOLINE ) injection 5 mg  5 mg Intravenous Q2H PRN Niu, Xilin, MD       hydrOXYzine (ATARAX) tablet 25 mg  25 mg Oral BID Von Bellis, MD   25 mg at 08/20/24 0933   iron polysaccharides (NIFEREX) capsule 150 mg  150 mg Oral Daily Von Bellis, MD   150 mg at 08/19/24 1023   megestrol (MEGACE) 400 MG/10ML suspension 400 mg  400 mg Oral Daily Von Bellis, MD   400 mg at 08/20/24 0935   memantine (NAMENDA) tablet 10 mg  10 mg Oral Daily Amin, Sumayya, MD   10 mg at 08/20/24 0931   metoprolol  tartrate (LOPRESSOR ) injection 2.5 mg  2.5 mg Intravenous Q2H PRN Niu, Xilin, MD   2.5 mg at 08/17/24 2031   mirtazapine (REMERON) tablet 7.5 mg  7.5 mg Oral QHS Amin, Sumayya, MD   7.5 mg at 08/19/24 2152   multivitamin with minerals tablet 1 tablet  1 tablet Oral Daily Von Bellis, MD   1 tablet at 08/20/24 0932   ondansetron  (ZOFRAN ) injection 4 mg  4 mg Intravenous Q8H PRN Niu, Xilin, MD       pantoprazole  (PROTONIX ) EC tablet 40 mg  40 mg Oral Daily Von Bellis, MD   40 mg at 08/20/24 0933   potassium chloride  SA (KLOR-CON  M) CR tablet 20 mEq  20 mEq Oral Once Amin, Sumayya, MD       QUEtiapine  (SEROQUEL ) tablet 25 mg  25 mg Oral QPC supper Von Bellis, MD   25 mg at 08/19/24 1751   rosuvastatin (CRESTOR) tablet 20 mg  20 mg Oral Daily Amin, Sumayya, MD       sodium chloride  flush (NS) 0.9 % injection 10-40 mL  10-40 mL Intracatheter PRN Von Bellis, MD       thiamine  (VITAMIN B1) tablet 100 mg  100 mg Oral Daily Von Bellis, MD   100 mg at 08/20/24 0932   Or   thiamine  (VITAMIN B1) injection  100 mg  100 mg Intravenous Daily Von Bellis, MD       Vitamin D (Ergocalciferol) (DRISDOL) 1.25 MG (50000 UNIT) capsule 50,000 Units  50,000 Units Oral Q7 days  Von Bellis, MD   50,000 Units at 08/17/24 1034     Discharge Medications: Please see discharge summary for a list of discharge medications.  Relevant Imaging Results:  Relevant Lab Results:   Additional Information ssn [id number]  Alfonso Rummer, LCSW

## 2024-08-20 NOTE — Progress Notes (Signed)
 Physical Therapy Treatment Patient Details Name: Ana Herrera MRN: [id number] DOB: 01/11/1956 Today's Date: 08/20/2024   History of Present Illness Pt is a 68 y/o female admitted secondary to cough, SOB and confusion. Pt found to have CAP. PMH including but not limited to dementia, hypertension, stroke without deficit, gout, tobacco abuse, alcohol abuse in remission, atrial fibrillation on Coumadin, RLS, SAH, UTI, brain aneurysm with clip repair.    PT Comments  Pt received up in chair for co-tx session with OT for safe progressive mobility. Pt on 2L O2 resting at 91-93%. Pt with improved mobilization tolerance from previous session, however continues to require assist for transfers and safety with gait primarily due to generalized weakness, dyspnea , and cognitive deficits affecting safety awareness and following cues. Pt also dropped to 83% upon returning from short distance gait, returning to 91% after seated rest break and cues for PLB technique. Initial recs for STR remain appropriate.   If plan is discharge home, recommend the following: A lot of help with walking and/or transfers;A lot of help with bathing/dressing/bathroom;Assistance with cooking/housework;Supervision due to cognitive status;Assist for transportation   Can travel by private vehicle     No (O2 Needs)  Equipment Recommendations  None recommended by PT (TBD at next level of care)    Recommendations for Other Services       Precautions / Restrictions Precautions Precautions: Fall Recall of Precautions/Restrictions: Impaired Restrictions Weight Bearing Restrictions Per Provider Order: No     Mobility  Bed Mobility               General bed mobility comments: not assessed, pt seated in recliner chair at beginning and end of session    Transfers Overall transfer level: Needs assistance Equipment used: Rolling walker (2 wheels) Transfers: Sit to/from Stand Sit to Stand: Min assist, +2  safety/equipment, +2 physical assistance           General transfer comment: initial MIN A +2 from recliner, MIN A +1 from toilet wiht grab bar and from standard chair, VC each time for hand placement    Ambulation/Gait Ambulation/Gait assistance: Min assist, +2 safety/equipment, +2 physical assistance Gait Distance (Feet):  (65) Assistive device: Rolling walker (2 wheels) Gait Pattern/deviations: Step-through pattern, Decreased step length - left Gait velocity: decreased     General Gait Details:  (Pt with moderate instability, high fall risk, very poor safety awareness, has difficulty maneuvering and staying inside RW.)   Stairs             Wheelchair Mobility     Tilt Bed    Modified Rankin (Stroke Patients Only)       Balance Overall balance assessment: Needs assistance Sitting-balance support: Feet supported Sitting balance-Leahy Scale: Fair     Standing balance support: Bilateral upper extremity supported, During functional activity, Reliant on assistive device for balance Standing balance-Leahy Scale: Fair Standing balance comment:  (Fair static standing with support of RW, heavy reliance on device during dynamic mobility)                            Communication Communication Communication: No apparent difficulties  Cognition Arousal: Alert Behavior During Therapy: WFL for tasks assessed/performed   PT - Cognitive impairments: History of cognitive impairments                       PT - Cognition Comments:  (Repeated cues to maintain simple tasks) Following  commands: Impaired Following commands impaired: Follows one step commands inconsistently, Follows one step commands with increased time    Cueing Cueing Techniques: Verbal cues, Gestural cues, Tactile cues  Exercises Other Exercises Other Exercises:  (Pt and family educated on POC and goals to transition to STR once medically cleared.)    General Comments General  comments (skin integrity, edema, etc.):  (Pt does not use O2 at baseline. Definite requirement of at least 2L O2 during functional activity, DOE requiring cues for PLB and pacing.)      Pertinent Vitals/Pain      Home Living                          Prior Function            PT Goals (current goals can now be found in the care plan section) Acute Rehab PT Goals Patient Stated Goal: go to rehab Progress towards PT goals: Progressing toward goals    Frequency    Min 2X/week      PT Plan      Co-evaluation   Reason for Co-Treatment: For patient/therapist safety;Necessary to address cognition/behavior during functional activity;To address functional/ADL transfers PT goals addressed during session: Mobility/safety with mobility;Balance;Proper use of DME OT goals addressed during session: ADL's and self-care;Proper use of Adaptive equipment and DME      AM-PAC PT 6 Clicks Mobility   Outcome Measure  Help needed turning from your back to your side while in a flat bed without using bedrails?: A Lot Help needed moving from lying on your back to sitting on the side of a flat bed without using bedrails?: A Lot Help needed moving to and from a bed to a chair (including a wheelchair)?: A Little Help needed standing up from a chair using your arms (e.g., wheelchair or bedside chair)?: A Little Help needed to walk in hospital room?: A Little Help needed climbing 3-5 steps with a railing? : A Lot 6 Click Score: 15    End of Session Equipment Utilized During Treatment: Gait belt;Oxygen (2L O2) Activity Tolerance: Patient tolerated treatment well Patient left: in chair;with call bell/phone within reach;with chair alarm set;with family/visitor present Nurse Communication: Mobility status PT Visit Diagnosis: Other abnormalities of gait and mobility (R26.89)     Time: 1400-1434 PT Time Calculation (min) (ACUTE ONLY): 34 min  Charges:    $Therapeutic Activity: 8-22  mins PT General Charges $$ ACUTE PT VISIT: 1 Visit                    Darice Bohr, PTA  Darice JAYSON Bohr 08/20/2024, 4:41 PM

## 2024-08-20 NOTE — Consult Note (Signed)
 PHARMACY CONSULT NOTE   Pharmacy Consult for Electrolyte Monitoring and Replacement   Recent Labs: Potassium (mmol/L)  Date Value  08/20/2024 3.4 (L)   Magnesium  (mg/dL)  Date Value  88/83/7974 1.8   Calcium (mg/dL)  Date Value  88/82/7974 8.5 (L)   Albumin (g/dL)  Date Value  88/87/7974 3.5   Phosphorus (mg/dL)  Date Value  88/82/7974 3.2   Sodium (mmol/L)  Date Value  08/20/2024 140     Assessment: Patient is a 68 y/o female with a PMH of  HTN, stroke without deficit, gout, Etoh abuse in remission, atrial fibrillation on apixaban , RLS, and SAH, presenting with AMS, cough and SOB. Pharmacy is asked to follow and replace electrolytes.   Goal of Therapy:  K+ 4, Mg > 2  Plan:  Kcl 40 mEq in AM and 20 mEq in the PM.  F/u with AM labs.   Cathaleen GORMAN Blanch, PharmD, BCPS Clinical Pharmacist 08/20/2024 6:41 AM

## 2024-08-20 NOTE — Plan of Care (Signed)

## 2024-08-21 ENCOUNTER — Inpatient Hospital Stay

## 2024-08-21 DIAGNOSIS — A419 Sepsis, unspecified organism: Secondary | ICD-10-CM | POA: Diagnosis not present

## 2024-08-21 DIAGNOSIS — I639 Cerebral infarction, unspecified: Secondary | ICD-10-CM | POA: Diagnosis not present

## 2024-08-21 DIAGNOSIS — J189 Pneumonia, unspecified organism: Secondary | ICD-10-CM | POA: Diagnosis not present

## 2024-08-21 DIAGNOSIS — R410 Disorientation, unspecified: Secondary | ICD-10-CM | POA: Diagnosis not present

## 2024-08-21 LAB — CBC
HCT: 33.2 % — ABNORMAL LOW (ref 36.0–46.0)
Hemoglobin: 10.6 g/dL — ABNORMAL LOW (ref 12.0–15.0)
MCH: 28.5 pg (ref 26.0–34.0)
MCHC: 31.9 g/dL (ref 30.0–36.0)
MCV: 89.2 fL (ref 80.0–100.0)
Platelets: 447 K/uL — ABNORMAL HIGH (ref 150–400)
RBC: 3.72 MIL/uL — ABNORMAL LOW (ref 3.87–5.11)
RDW: 13.7 % (ref 11.5–15.5)
WBC: 9.1 K/uL (ref 4.0–10.5)
nRBC: 0 % (ref 0.0–0.2)

## 2024-08-21 LAB — BASIC METABOLIC PANEL WITH GFR
Anion gap: 8 (ref 5–15)
BUN: 8 mg/dL (ref 8–23)
CO2: 24 mmol/L (ref 22–32)
Calcium: 8.9 mg/dL (ref 8.9–10.3)
Chloride: 108 mmol/L (ref 98–111)
Creatinine, Ser: 0.5 mg/dL (ref 0.44–1.00)
GFR, Estimated: >60 mL/min (ref >60)
Glucose, Bld: 84 mg/dL (ref 70–99)
Potassium: 4.2 mmol/L (ref 3.5–5.1)
Sodium: 140 mmol/L (ref 135–145)

## 2024-08-21 LAB — MAGNESIUM: Magnesium: 1.9 mg/dL (ref 1.7–2.4)

## 2024-08-21 MED ORDER — VITAMIN B-1 100 MG PO TABS: 100.0000 mg | ORAL_TABLET | Freq: Every day | ORAL | Status: AC

## 2024-08-21 MED ORDER — MEGESTROL ACETATE 400 MG/10ML PO SUSP: 400.0000 mg | Freq: Every day | ORAL | Status: AC

## 2024-08-21 MED ORDER — CYANOCOBALAMIN 1000 MCG PO TABS: 1000.0000 ug | ORAL_TABLET | Freq: Every day | ORAL | Status: AC

## 2024-08-21 MED ORDER — DILTIAZEM HCL ER COATED BEADS 300 MG PO CP24: 300.0000 mg | ORAL_CAPSULE | Freq: Every day | ORAL | Status: AC

## 2024-08-21 MED ORDER — AMOXICILLIN-POT CLAVULANATE 875-125 MG PO TABS: 1.0000 | ORAL_TABLET | Freq: Two times a day (BID) | ORAL | Status: AC

## 2024-08-21 MED ORDER — FOLIC ACID 1 MG PO TABS: 1.0000 mg | ORAL_TABLET | Freq: Every day | ORAL | Status: AC

## 2024-08-21 MED ORDER — DM-GUAIFENESIN ER 30-600 MG PO TB12: 1.0000 | ORAL_TABLET | Freq: Two times a day (BID) | ORAL | Status: AC | PRN

## 2024-08-21 MED ORDER — ROSUVASTATIN CALCIUM 20 MG PO TABS: 20.0000 mg | ORAL_TABLET | Freq: Every day | ORAL | Status: AC

## 2024-08-21 MED ORDER — PANTOPRAZOLE SODIUM 40 MG PO TBEC: 40.0000 mg | DELAYED_RELEASE_TABLET | Freq: Every day | ORAL | Status: AC

## 2024-08-21 MED ORDER — QUETIAPINE FUMARATE 25 MG PO TABS: 25.0000 mg | ORAL_TABLET | Freq: Every day | ORAL | Status: AC

## 2024-08-21 MED ORDER — HYDROXYZINE HCL 25 MG PO TABS: 25.0000 mg | ORAL_TABLET | Freq: Two times a day (BID) | ORAL | Status: AC

## 2024-08-21 MED ORDER — ASCORBIC ACID 500 MG PO TABS: 500.0000 mg | ORAL_TABLET | Freq: Every day | ORAL | Status: AC

## 2024-08-21 MED ORDER — POLYSACCHARIDE IRON COMPLEX 150 MG PO CAPS: 150.0000 mg | ORAL_CAPSULE | Freq: Every day | ORAL | Status: AC

## 2024-08-21 MED ORDER — VITAMIN D (ERGOCALCIFEROL) 1.25 MG (50000 UNIT) PO CAPS: 50000.0000 [IU] | ORAL_CAPSULE | ORAL | Status: AC

## 2024-08-21 NOTE — Discharge Summary (Signed)
 Physician Discharge Summary   Patient: Ana Herrera MRN: [id number] DOB: [birth year]  Admit date:     08/15/2024  Discharge date: 08/21/24  Discharge Physician: Amaryllis Dare   PCP: Eliverto Bette Hover, MD   Recommendations at discharge:  Please obtain CBC and BMP on follow-up Please repeat chest x-ray in 4 to 6 weeks Follow-up with primary care provider Please reevaluate the necessity to continue Megace.   Discharge Diagnoses: Principal Problem:   CAP (community acquired pneumonia) Active Problems:   Confusion and disorientation   Sepsis (HCC)   HTN (hypertension)   Stroke [facility])   Atrial fibrillation, chronic (HCC)   Dementia without behavioral disturbance [facility])   Hospital Course:  Ana Herrera is a 68 y.o. female with medical history significant of hypertension, stroke without deficit, gout, tobacco abuse, alcohol abuse in remission, atrial fibrillation on Coumadin, RLS, SAH, UTI, brain aneurysm with clip repair, who presents with cough, SOB, disorientation.   On presentation patient was afebrile, mild intermittent RVR, labs with leukocytosis, lactic acidosis at 3. CXR with slight central congestion and suspicion for possible pneumonia.  CT chest with bilateral multifocal pneumonia with mild pleural effusions.  Patient was found to have severe sepsis secondary to community-acquired streptococcal pneumonia, urine strep antigen was positive.  Patient also had acute hypoxic respiratory failure secondary to pneumonia, still needing some oxygen at 1 to 2 L but improved overall.  No baseline oxygen use.  Rehab can slowly wean off.  Patient initially received ceftriaxone  which was later transitioned to Unasyn and she is being discharged on Augmentin  for 2 more days to complete the course.  Still having some cough, patient need continue to work with incentive spirometry and can use Mucinex DM as needed.  Her mentation slowly improved which is likely secondary to sepsis  with pneumonia.  She did develop some hospital acquired sundowning and started on Seroquel .  Mentation now at baseline.  She can continue Seroquel  while her stay at rehab.  Patient with history of chronic atrial fibrillation, having intermittent mild RVR and blood pressure was mildly elevated.  We increased the home Cardizem  dose from 240 mg to 300 mg daily which resulted in improvement in heart rate and blood pressure.  She is being discharged on higher dose of Cardizem  of 300 mg daily.  She need to follow-up with her cardiologist for further assistance.  She will continue with Eliquis .  Patient had multiple electrolyte abnormalities during the course of hospitalization like hypokalemia, hypomagnesemia and hypophosphatemia which was corrected before discharge.  She was also found to have iron, vitamin D and B12 deficiency and started on supplements which she will continue on discharge and PCP can follow-up.  Patient has quite a bit of anxiety while in the hospital and started on twice daily Atarax which really helped and continued on discharge.  Patient with anorexia and poor p.o. intake.  We added Megace to her home Remeron and continued on discharge to rehab.  Her PCP can reevaluate the necessity and stop when appropriate.  Our physical therapist evaluated her and recommended going to SNF for rehab before returning home where she is being discharged.  She will continue on current medications and need to have a close follow-up with her providers for further assistance.  Consultants: None Procedures performed: None Disposition: Skilled nursing facility Diet recommendation:  Discharge Diet Orders (From admission, onward)     Start     Ordered   08/21/24 0000  Diet - low sodium heart healthy  08/21/24 1217           Dysphagia type 2 thin Liquid DISCHARGE MEDICATION: Allergies as of 08/21/2024   No Known Allergies      Medication List     STOP taking these medications     donepezil  10 MG tablet Commonly known as: ARICEPT    lisinopril  20 MG tablet Commonly known as: ZESTRIL        TAKE these medications    amoxicillin -clavulanate 875-125 MG tablet Commonly known as: AUGMENTIN  Take 1 tablet by mouth every 12 (twelve) hours for 2 days.   ascorbic acid 500 MG tablet Commonly known as: VITAMIN C Take 1 tablet (500 mg total) by mouth daily. Start taking on: August 22, 2024   CertaVite/Antioxidants Tabs Take 1 tablet by mouth daily.   citalopram 10 MG tablet Commonly known as: CELEXA Take 1 tablet by mouth daily.   cyanocobalamin 1000 MCG tablet Take 1 tablet (1,000 mcg total) by mouth daily. Start taking on: August 24, 2024   dextromethorphan-guaiFENesin 30-600 MG 12hr tablet Commonly known as: MUCINEX DM Take 1 tablet by mouth 2 (two) times daily as needed.   diltiazem  300 MG 24 hr capsule Commonly known as: CARDIZEM  CD Take 1 capsule (300 mg total) by mouth daily. What changed:  medication strength how much to take when to take this   Eliquis  5 MG Tabs tablet Generic drug: apixaban  Take 1 tablet (5 mg total) by mouth 2 (two) times daily.   feeding supplement Liqd Take 237 mLs by mouth 3 (three) times daily between meals.   folic acid 1 MG tablet Commonly known as: FOLVITE Take 1 tablet (1 mg total) by mouth daily. Start taking on: August 22, 2024   hydrOXYzine 25 MG tablet Commonly known as: ATARAX Take 1 tablet (25 mg total) by mouth 2 (two) times daily.   iron polysaccharides 150 MG capsule Commonly known as: NIFEREX Take 1 capsule (150 mg total) by mouth daily.   megestrol 400 MG/10ML suspension Commonly known as: MEGACE Take 10 mLs (400 mg total) by mouth daily. Start taking on: August 22, 2024   memantine 10 MG tablet Commonly known as: NAMENDA Take 10 mg by mouth daily.   mirtazapine 7.5 MG tablet Commonly known as: REMERON Take 7.5 mg by mouth at bedtime.   nicotine  21 mg/24hr patch Commonly  known as: NICODERM CQ  - dosed in mg/24 hours Place 1 patch (21 mg total) onto the skin daily.   pantoprazole  40 MG tablet Commonly known as: PROTONIX  Take 1 tablet (40 mg total) by mouth daily. Start taking on: August 22, 2024   QUEtiapine  25 MG tablet Commonly known as: SEROQUEL  Take 1 tablet (25 mg total) by mouth daily after supper.   rosuvastatin 20 MG tablet Commonly known as: CRESTOR Take 1 tablet (20 mg total) by mouth daily.   thiamine  100 MG tablet Commonly known as: Vitamin B-1 Take 1 tablet (100 mg total) by mouth daily. Start taking on: August 22, 2024   Vitamin D (Ergocalciferol) 1.25 MG (50000 UNIT) Caps capsule Commonly known as: DRISDOL Take 1 capsule (50,000 Units total) by mouth every 7 (seven) days. Start taking on: August 24, 2024        Contact information for follow-up providers     Eliverto, Bette Hover, MD. Schedule an appointment as soon as possible for a visit in 1 week(s).   Specialty: Family Medicine Contact information: [address] [place] [address] [phone]  Contact information for after-discharge care     Destination     [facility] .   Service: Skilled Nursing Contact information: [address] [place]  [address] [phone]                    Discharge Exam: Fredricka Weights   08/15/24 2058 08/15/24 2346  Weight: 68.4 kg 68.4 kg   General.  Frail and malnourished elderly lady, in no acute distress. Pulmonary.  Few scattered rhonchi bilaterally, normal respiratory effort. CV.  Regular rate and rhythm, no JVD, rub or murmur. Abdomen.  Soft, nontender, nondistended, BS positive. CNS.  Alert and oriented .  No focal neurologic deficit. Extremities.  No edema, pulses intact and symmetrical. Psychiatry.  Judgment and insight appears normal.   Condition at discharge: stable  The results of significant diagnostics from this hospitalization  (including imaging, microbiology, ancillary and laboratory) are listed below for reference.   Imaging Studies: CT CHEST ABDOMEN PELVIS W CONTRAST Result Date: 08/18/2024 CLINICAL DATA:  Sepsis, pneumonia, abdominal pain, and dysuria. EXAM: CT CHEST, ABDOMEN, AND PELVIS WITH CONTRAST TECHNIQUE: Multidetector CT imaging of the chest, abdomen and pelvis was performed following the standard protocol during bolus administration of intravenous contrast. RADIATION DOSE REDUCTION: This exam was performed according to the departmental dose-optimization program which includes automated exposure control, adjustment of the mA and/or kV according to patient size and/or use of iterative reconstruction technique. CONTRAST:  OMNIPAQUE  IOHEXOL  300 MG/ML  SOLN COMPARISON:  02/29/2024. FINDINGS: CT CHEST FINDINGS Cardiovascular: The heart is enlarged and there is a small pericardial effusion. Multi-vessel coronary artery calcifications are noted. There is atherosclerotic calcification of the aorta without evidence of aneurysm. The pulmonary trunk is mildly distended suggesting underlying pulmonary artery hypertension. Mediastinum/Nodes: Prominent lymph nodes are present in the mediastinum measuring up to 1.8 cm in the pretracheal space. No hilar or axillary lymphadenopathy is seen. The right lobe of the thyroid gland is enlarged. The trachea and esophagus are within normal limits. Lungs/Pleura: There small bilateral pleural effusions with consolidation at the lung bases bilaterally. Emphysematous changes are noted in the lungs. Scattered airspace disease is noted in the lungs bilaterally. Musculoskeletal: Degenerative changes are present in the thoracic spine. No acute osseous abnormality. CT ABDOMEN PELVIS FINDINGS Hepatobiliary: No focal liver abnormality is seen. No gallstones, gallbladder wall thickening, or biliary dilatation. Pancreas: Unremarkable. No pancreatic ductal dilatation or surrounding inflammatory changes.  Spleen: Normal in size without focal abnormality. Adrenals/Urinary Tract: The adrenal glands are within normal limits. The kidneys enhance symmetrically. No renal calculus or hydronephrosis bilaterally. The bladder is unremarkable. Stomach/Bowel: The stomach is within normal limits. No bowel obstruction, free air, or pneumatosis is seen. Appendicoliths are noted without evidence of acute appendicitis. Vascular/Lymphatic: Aortic atherosclerosis. No enlarged abdominal or pelvic lymph nodes. Reproductive: Uterus and bilateral adnexa are unremarkable. Other: No abdominopelvic ascites. Musculoskeletal: Degenerative changes are present in the lumbar spine. No acute osseous abnormality. IMPRESSION: 1. Scattered airspace opacities in the lungs bilaterally with bilateral lower lobe consolidation, suggesting multifocal pneumonia. 2. Small bilateral pleural effusions. 3. No acute abnormality in the abdomen and pelvis. 4. Aortic atherosclerosis and coronary artery calcifications. Electronically Signed   By: Leita Birmingham M.D.   On: 08/18/2024 17:10   CT HEAD WO CONTRAST ( ) Result Date: 08/16/2024 CLINICAL DATA:  Altered mental status EXAM: CT HEAD WITHOUT CONTRAST TECHNIQUE: Contiguous axial images were obtained from the base of the skull through the vertex without intravenous contrast. RADIATION  DOSE REDUCTION: This exam was performed according to the departmental dose-optimization program which includes automated exposure control, adjustment of the mA and/or kV according to patient size and/or use of iterative reconstruction technique. COMPARISON:  02/29/2024 FINDINGS: Brain: Chronic atrophic changes and chronic white matter ischemic changes are seen. Findings of prior aneurysm clipping are noted on the right stable from the prior exam. Right-sided craniotomy is noted and stable. No acute hemorrhage, acute infarction or space-occupying mass lesion is noted. Vascular: Changes of prior right-sided aneurysm clipping.  Skull: Prior right craniotomy. Sinuses/Orbits: Paranasal sinuses demonstrate significant mucosal thickening consistent with chronic sinusitis. No orbital abnormality is noted. Other: None IMPRESSION: Chronic changes consistent with prior aneurysm clipping. Chronic atrophic and ischemic changes without acute abnormality. Electronically Signed   By: Oneil Devonshire M.D.   On: 08/16/2024 01:48   DG Chest Port 1 View if patient is in a treatment room. Result Date: 08/15/2024 CLINICAL DATA:  Altered possible sepsis EXAM: PORTABLE CHEST 1 VIEW COMPARISON:  02/29/2024 FINDINGS: Mild cardiomegaly with slight central congestion. Suspicion of airspace disease at the left base. No pleural effusion or pneumothorax. Aortic atherosclerosis. IMPRESSION: Mild cardiomegaly with slight central congestion. Suspicion of airspace disease at the left base, possible pneumonia. Electronically Signed   By: Luke Bun M.D.   On: 08/15/2024 21:24    Microbiology: Results for orders placed or performed during the hospital encounter of 08/15/24  Culture, blood (Routine x 2)     Status: None   Collection Time: 08/15/24  9:08 PM   Specimen: BLOOD  Result Value Ref Range Status   Specimen Description BLOOD RIGHT ASSIST CONTROL  Final   Special Requests   Final    BOTTLES DRAWN AEROBIC AND ANAEROBIC Blood Culture results may not be optimal due to an inadequate volume of blood received in culture bottles   Culture   Final    NO GROWTH 5 DAYS Performed at [facility], [address] [place], [place] [address]    Report Status 08/20/2024 FINAL  Final  Culture, blood (Routine x 2)     Status: None   Collection Time: 08/15/24  9:08 PM   Specimen: BLOOD  Result Value Ref Range Status   Specimen Description BLOOD LEFT ASSIST CONTROL  Final   Special Requests   Final    BOTTLES DRAWN AEROBIC AND ANAEROBIC Blood Culture results may not be optimal due to an inadequate volume of blood received in culture bottles    Culture   Final    NO GROWTH 5 DAYS Performed at [facility], [address] [place], [place] [address]    Report Status 08/20/2024 FINAL  Final    Labs: CBC: Recent Labs  Lab 08/15/24 2108 08/16/24 0116 08/17/24 0332 08/18/24 0537 08/19/24 0459 08/20/24 0447 08/21/24 0513  WBC 16.5*   < > 11.3* 8.8 9.8 8.2 9.1  NEUTROABS 15.2*  --   --   --   --   --   --   HGB 13.4   < > 11.7* 10.7* 11.8* 11.4* 10.6*  HCT 41.4   < > 36.3 32.5* 36.0 35.1* 33.2*  MCV 88.5   < > 88.8 87.4 87.8 88.2 89.2  PLT 232   < > 309 316 401* 420* 447*   < > = values in this interval not displayed.   Basic Metabolic Panel: Recent Labs  Lab 08/16/24 0116 08/17/24 0332 08/18/24 0537 08/19/24 0459 08/20/24 0447 08/21/24 0513  NA 135 139 141 141 140 140  K 3.2* 3.2* 3.2* 3.8 3.4* 4.2  CL 100 102 106 104 106 108  CO2 24 26 28 28 25 24   GLUCOSE 107* 93 91 82 91 84  BUN 17 9 6* <5* 5* 8  CREATININE 0.58 0.43* 0.40* 0.46 0.44 0.50  CALCIUM 8.7* 8.9 8.9 9.3 8.5* 8.9  MG 1.6* 1.8 2.0 1.8  --  1.9  PHOS 2.1* 2.7 2.8 2.0* 3.2  --    Liver Function Tests: Recent Labs  Lab 08/15/24 2108  AST 25  ALT 14  ALKPHOS 136*  BILITOT 2.6*  PROT 7.4  ALBUMIN 3.5   CBG: No results for input(s): GLUCAP in the last 168 hours.  Discharge time spent: greater than 30 minutes.  This record has been created using Conservation officer, historic buildings. Errors have been sought and corrected,but may not always be located. Such creation errors do not reflect on the standard of care.   Signed: Amaryllis Dare, MD [facility] 08/21/2024

## 2024-08-21 NOTE — Progress Notes (Signed)
 Occupational Therapy Treatment Patient Details Name: Ana Herrera MRN: [id number] DOB: 1956-08-26 Today's Date: 08/21/2024   History of present illness Pt is a 68 y/o female admitted secondary to cough, SOB and confusion. Pt found to have CAP. PMH including but not limited to dementia, hypertension, stroke without deficit, gout, tobacco abuse, alcohol abuse in remission, atrial fibrillation on Coumadin, RLS, SAH, UTI, brain aneurysm with clip repair.   OT comments  Pt seen for OT tx. Pt up in the recliner, daughter present and supportive. Pt further instructed in incentive spirometer use and flutter valve use to optimize proper technique and recall/carryover. Pt demo'd improved technique requiring less VC for IS use. Continued to require moderate VC for flutter valve use. Pt/family also educated in home/routines modifications for home set up, AE/DME, and incontinence management to improve safety while maximizing independence. Pt/daughter verbalized understanding. Will continue to progress.      If plan is discharge home, recommend the following:  A lot of help with walking and/or transfers;A lot of help with bathing/dressing/bathroom;Assistance with cooking/housework;Assist for transportation;Help with stairs or ramp for entrance;Direct supervision/assist for financial management;Direct supervision/assist for medications management;Supervision due to cognitive status   Equipment Recommendations  Other (comment) (defer)    Recommendations for Other Services      Precautions / Restrictions Precautions Precautions: Fall Recall of Precautions/Restrictions: Impaired Restrictions Weight Bearing Restrictions Per Provider Order: No       Mobility Bed Mobility               General bed mobility comments: not assessed, pt seated in recliner chair at beginning and end of session    Transfers                   General transfer comment: declined     Balance                                            ADL either performed or assessed with clinical judgement   ADL Overall ADL's : Needs assistance/impaired          Communication Communication Communication: No apparent difficulties   Cognition Arousal: Alert Behavior During Therapy: WFL for tasks assessed/performed Cognition: History of cognitive impairments        Following commands: Impaired Following commands impaired: Follows one step commands inconsistently, Follows one step commands with increased time      Cueing   Cueing Techniques: Verbal cues, Gestural cues, Tactile cues  Exercises Other Exercises Other Exercises: Pt further instructed in incentive spirometer use and flutter valve use to optimize proper technique and recall/carryover. Other Exercises: Pt/family educated in home/routines modifications for home set up, AE/DME, and incontinence management to improve safety while maximizing independence            Pertinent Vitals/ Pain       Pain Assessment Pain Assessment: No/denies pain   Frequency  Min 2X/week        Progress Toward Goals  OT Goals(current goals can now be found in the care plan section)  Progress towards OT goals: Progressing toward goals  Acute Rehab OT Goals Patient Stated Goal: go to rehab OT Goal Formulation: With patient/family Time For Goal Achievement: 09/01/24 Potential to Achieve Goals: Fair   AM-PAC OT 6 Clicks Daily Activity     Outcome Measure   Help from another person eating meals?: None Help from  another person taking care of personal grooming?: A Little Help from another person toileting, which includes using toliet, bedpan, or urinal?: A Little Help from another person bathing (including washing, rinsing, drying)?: A Little Help from another person to put on and taking off regular upper body clothing?: None Help from another person to put on and taking off regular lower body clothing?: A Little 6 Click  Score: 20    End of Session Equipment Utilized During Treatment: Oxygen  OT Visit Diagnosis: Unsteadiness on feet (R26.81);Repeated falls (R29.6);Muscle weakness (generalized) (M62.81)   Activity Tolerance Patient tolerated treatment well   Patient Left in chair;with call bell/phone within reach;with chair alarm set;with family/visitor present           Time: 9089-9066 OT Time Calculation (min): 23 min  Charges: OT General Charges $OT Visit: 1 Visit OT Treatments $Self Care/Home Management : 8-22 mins $Therapeutic Activity: 8-22 mins  Warren SAUNDERS., MPH, MS, OTR/L ascom [phone] 08/21/24, 10:33 AM

## 2024-08-21 NOTE — Consult Note (Signed)
 PHARMACY CONSULT NOTE   Pharmacy Consult for Electrolyte Monitoring and Replacement   Recent Labs: Potassium (mmol/L)  Date Value  08/21/2024 4.2   Magnesium  (mg/dL)  Date Value  88/81/7974 1.9   Calcium (mg/dL)  Date Value  88/81/7974 8.9   Albumin (g/dL)  Date Value  88/87/7974 3.5   Phosphorus (mg/dL)  Date Value  88/82/7974 3.2   Sodium (mmol/L)  Date Value  08/21/2024 140     Assessment: Patient is a 68 y/o female with a PMH of  HTN, stroke without deficit, gout, Etoh abuse in remission, atrial fibrillation on apixaban , RLS, and SAH, presenting with AMS, cough and SOB. Pharmacy is asked to follow and replace electrolytes.   Goal of Therapy:  K+ 4, Mg > 2  Plan:  No replacement needed F/u with AM labs.   Cathaleen GORMAN Blanch, PharmD, BCPS Clinical Pharmacist 08/21/2024 10:43 AM

## 2024-08-21 NOTE — Plan of Care (Signed)

## 2024-08-21 NOTE — TOC Transition Note (Signed)
 Transition of Care [facility]) - Discharge Note   Patient Details  Name: Ana Herrera MRN: [id number] Date of Birth: [birth year]  Transition of [facility]) CM/SW Contact:  Alfonso Rummer, LCSW Phone Number: 08/21/2024, 2:00 PM   Clinical Narrative:     Pt will transition to [facility] for SNF via [facility]. Pt daughter at bedside and aware of pt discharge.   Final next level of care: Skilled Nursing Facility Barriers to Discharge: Barriers Resolved   Patient Goals and CMS Choice     Choice offered to / list presented to : Patient, Adult Children      Discharge Placement             [facility]      Name of family member notified: pt daughter Daphne at bedside Patient and family notified of of transfer: 08/21/24  Discharge Plan and Services Additional resources added to the After Visit Summary for                                       Social Drivers of Health (SDOH) Interventions SDOH Screenings   Food Insecurity: No Food Insecurity (08/16/2024)  Housing: Unknown (08/16/2024)  Transportation Needs: No Transportation Needs (08/16/2024)  Utilities: Not At Risk (08/16/2024)  Social Connections: Socially Isolated (08/16/2024)  Tobacco Use: Medium Risk (07/16/2024)   Received from [facility]     Readmission Risk Interventions     No data to display

## 2024-11-09 ENCOUNTER — Emergency Department (HOSPITAL_COMMUNITY)

## 2024-11-09 ENCOUNTER — Emergency Department (HOSPITAL_COMMUNITY)
Admission: EM | Admit: 2024-11-09 | Discharge: 2024-11-09 | Disposition: A | Discharge status: Home / Self Care | Source: Home / Self Care | Attending: Emergency Medicine | Admitting: Emergency Medicine

## 2024-11-09 DIAGNOSIS — N3000 Acute cystitis without hematuria: Secondary | ICD-10-CM

## 2024-11-09 DIAGNOSIS — W19XXXA Unspecified fall, initial encounter: Secondary | ICD-10-CM

## 2024-11-09 LAB — COMPREHENSIVE METABOLIC PANEL WITH GFR
ALT: 13 U/L (ref 0–44)
AST: 18 U/L (ref 15–41)
Albumin: 4 g/dL (ref 3.5–5.0)
Alkaline Phosphatase: 123 U/L (ref 38–126)
Anion gap: 12 (ref 5–15)
BUN: 10 mg/dL (ref 8–23)
CO2: 22 mmol/L (ref 22–32)
Calcium: 10.2 mg/dL (ref 8.9–10.3)
Chloride: 106 mmol/L (ref 98–111)
Creatinine, Ser: 0.78 mg/dL (ref 0.44–1.00)
GFR, Estimated: >60 mL/min
Glucose, Bld: 101 mg/dL — ABNORMAL HIGH (ref 70–99)
Potassium: 4.6 mmol/L (ref 3.5–5.1)
Sodium: 140 mmol/L (ref 135–145)
Total Bilirubin: 0.4 mg/dL (ref 0.0–1.2)
Total Protein: 7 g/dL (ref 6.5–8.1)

## 2024-11-09 LAB — CBC WITH DIFFERENTIAL/PLATELET
Abs Immature Granulocytes: 0.03 10*3/uL (ref 0.00–0.07)
Basophils Absolute: 0.1 10*3/uL (ref 0.0–0.1)
Basophils Relative: 1 %
Eosinophils Absolute: 0.2 10*3/uL (ref 0.0–0.5)
Eosinophils Relative: 2 %
HCT: 43.9 % (ref 36.0–46.0)
Hemoglobin: 14.9 g/dL (ref 12.0–15.0)
Immature Granulocytes: 0 %
Lymphocytes Relative: 25 %
Lymphs Abs: 2.1 10*3/uL (ref 0.7–4.0)
MCH: 29.7 pg (ref 26.0–34.0)
MCHC: 33.9 g/dL (ref 30.0–36.0)
MCV: 87.6 fL (ref 80.0–100.0)
Monocytes Absolute: 0.7 10*3/uL (ref 0.1–1.0)
Monocytes Relative: 8 %
Neutro Abs: 5.4 10*3/uL (ref 1.7–7.7)
Neutrophils Relative %: 64 %
Platelets: 289 10*3/uL (ref 150–400)
RBC: 5.01 MIL/uL (ref 3.87–5.11)
RDW: 14.1 % (ref 11.5–15.5)
WBC: 8.5 10*3/uL (ref 4.0–10.5)
nRBC: 0 % (ref 0.0–0.2)

## 2024-11-09 LAB — URINALYSIS, ROUTINE W REFLEX MICROSCOPIC
Bilirubin Urine: NEGATIVE
Glucose, UA: NEGATIVE mg/dL
Hgb urine dipstick: NEGATIVE
Ketones, ur: NEGATIVE mg/dL
Nitrite: NEGATIVE
Protein, ur: NEGATIVE mg/dL
Specific Gravity, Urine: 1.013 (ref 1.005–1.030)
pH: 5 (ref 5.0–8.0)

## 2024-11-09 LAB — I-STAT CHEM 8, ED
BUN: 10 mg/dL (ref 8–23)
Calcium, Ion: 1.29 mmol/L (ref 1.15–1.40)
Chloride: 106 mmol/L (ref 98–111)
Creatinine, Ser: 0.9 mg/dL (ref 0.44–1.00)
Glucose, Bld: 102 mg/dL — ABNORMAL HIGH (ref 70–99)
HCT: 43 % (ref 36.0–46.0)
Hemoglobin: 14.6 g/dL (ref 12.0–15.0)
Potassium: 4.4 mmol/L (ref 3.5–5.1)
Sodium: 142 mmol/L (ref 135–145)
TCO2: 22 mmol/L (ref 22–32)

## 2024-11-09 MED ORDER — CEPHALEXIN 500 MG PO CAPS: 500.0000 mg | ORAL_CAPSULE | Freq: Four times a day (QID) | ORAL | 0 refills | Status: AC

## 2024-11-09 NOTE — ED Provider Notes (Cosign Needed)
 " [facility] Provider Note   CSN: [id number] Arrival date & time: 11/09/24  1944     Patient presents with: Ana Herrera   Ana Herrera is a 69 y.o. female.  Patient presented to the emergency department today with concerns of hypertension, atrial fibrillation presents ED with concerns of fall.  Reportedly had a fall resulting on impact to the posterior part of her head.  She is on Eliquis .  No reported loss of consciousness.  Unclear if patient was feeling at all dizzy, lightheaded, or weak prior to episode of falling.  Patient reports that she felt that she tripped over an object.  At this time, she is back to her baseline.  She does have history of dementia.   Fall       Prior to Admission medications  Medication Sig Start Date End Date Taking? Authorizing Provider  cephALEXin  (KEFLEX ) 500 MG capsule Take 1 capsule (500 mg total) by mouth 4 (four) times daily for 5 days. 11/09/24 11/14/24 Yes Pershing Skidmore A, PA-C  apixaban  (ELIQUIS ) 5 MG TABS tablet Take 1 tablet (5 mg total) by mouth 2 (two) times daily. 05/23/23 08/16/24  Darci Pore, MD  ascorbic acid  (VITAMIN C ) 500 MG tablet Take 1 tablet (500 mg total) by mouth daily. 08/22/24   Caleen Qualia, MD  citalopram  (CELEXA ) 10 MG tablet Take 1 tablet by mouth daily. 11/30/23 11/29/24  [provider]  cyanocobalamin  1000 MCG tablet Take 1 tablet (1,000 mcg total) by mouth daily. 08/24/24   Amin, Sumayya, MD  dextromethorphan-guaiFENesin  (MUCINEX  DM) 30-600 MG 12hr tablet Take 1 tablet by mouth 2 (two) times daily as needed. 08/21/24   Caleen Qualia, MD  diltiazem  (CARDIZEM  CD) 300 MG 24 hr capsule Take 1 capsule (300 mg total) by mouth daily. 08/21/24 08/21/25  Amin, Sumayya, MD  feeding supplement (ENSURE ENLIVE / ENSURE PLUS) LIQD Take 237 mLs by mouth 3 (three) times daily between meals. 05/23/23   Darci Pore, MD  folic acid  (FOLVITE ) 1 MG tablet Take 1 tablet (1 mg  total) by mouth daily. 08/22/24   Amin, Sumayya, MD  hydrOXYzine  (ATARAX ) 25 MG tablet Take 1 tablet (25 mg total) by mouth 2 (two) times daily. 08/21/24   Amin, Sumayya, MD  iron  polysaccharides (NIFEREX) 150 MG capsule Take 1 capsule (150 mg total) by mouth daily. 08/21/24   Amin, Sumayya, MD  megestrol  (MEGACE ) 400 MG/10ML suspension Take 10 mLs (400 mg total) by mouth daily. 08/22/24   Caleen Qualia, MD  memantine  (NAMENDA ) 10 MG tablet Take 10 mg by mouth daily. 07/16/24   [provider]  mirtazapine  (REMERON ) 7.5 MG tablet Take 7.5 mg by mouth at bedtime. 07/16/24   [provider]  Multiple Vitamin (MULTIVITAMIN WITH MINERALS) TABS tablet Take 1 tablet by mouth daily. Patient not taking: Reported on 08/16/2024 05/24/23   Darci Pore, MD  nicotine  (NICODERM CQ  - DOSED IN MG/24 HOURS) 21 mg/24hr patch Place 1 patch (21 mg total) onto the skin daily. 12/03/22   Awanda City, MD  pantoprazole  (PROTONIX ) 40 MG tablet Take 1 tablet (40 mg total) by mouth daily. 08/22/24   Amin, Sumayya, MD  QUEtiapine  (SEROQUEL ) 25 MG tablet Take 1 tablet (25 mg total) by mouth daily after supper. 08/21/24   Amin, Sumayya, MD  rosuvastatin  (CRESTOR ) 20 MG tablet Take 1 tablet (20 mg total) by mouth daily. 08/21/24   Amin, Sumayya, MD  thiamine  (VITAMIN B-1) 100 MG tablet Take 1 tablet (  100 mg total) by mouth daily. 08/22/24   Caleen Qualia, MD  Vitamin D , Ergocalciferol , (DRISDOL ) 1.25 MG (50000 UNIT) CAPS capsule Take 1 capsule (50,000 Units total) by mouth every 7 (seven) days. 08/24/24   Amin, Sumayya, MD    Allergies: Patient has no known allergies.    Review of Systems  HENT:         Head injury  All other systems reviewed and are negative.   Updated Vital Signs BP (!) 133/97   Pulse 89   Temp 98.4 F (36.9 C) (Oral)   Resp (!) 21   Ht 5' 5 (1.651 m)   Wt 68.4 kg   SpO2 99%   BMI 25.09 kg/m   Physical Exam Vitals and nursing note reviewed.  Constitutional:       General: She is not in acute distress.    Appearance: She is well-developed.  HENT:     Head: Normocephalic and atraumatic.     Comments: TTP to the right posterior scalp, no laceration, abrasion, or hematoma seen. Eyes:     Conjunctiva/sclera: Conjunctivae normal.  Cardiovascular:     Rate and Rhythm: Normal rate and regular rhythm.     Heart sounds: No murmur heard. Pulmonary:     Effort: Pulmonary effort is normal. No respiratory distress.     Breath sounds: Normal breath sounds.  Abdominal:     Palpations: Abdomen is soft.     Tenderness: There is no abdominal tenderness.  Musculoskeletal:        General: No swelling.     Cervical back: Neck supple.  Skin:    General: Skin is warm and dry.     Capillary Refill: Capillary refill takes less than 2 seconds.  Neurological:     Mental Status: She is alert.  Psychiatric:        Mood and Affect: Mood normal.     (all labs ordered are listed, but only abnormal results are displayed) Labs Reviewed  COMPREHENSIVE METABOLIC PANEL WITH GFR - Abnormal; Notable for the following components:      Result Value   Glucose, Bld 101 (*)    All other components within normal limits  URINALYSIS, ROUTINE W REFLEX MICROSCOPIC - Abnormal; Notable for the following components:   APPearance HAZY (*)    Leukocytes,Ua MODERATE (*)    Bacteria, UA RARE (*)    All other components within normal limits  I-STAT CHEM 8, ED - Abnormal; Notable for the following components:   Glucose, Bld 102 (*)    All other components within normal limits  URINE CULTURE  CBC WITH DIFFERENTIAL/PLATELET    EKG: None  Radiology: CT Cervical Spine Wo Contrast Result Date: 11/09/2024 EXAM: CT CERVICAL SPINE WITHOUT CONTRAST 11/09/2024 08:52:51 PM TECHNIQUE: CT of the cervical spine was performed without the administration of intravenous contrast. Multiplanar reformatted images are provided for review. Automated exposure control, iterative reconstruction, and/or  weight based adjustment of the mA/kV was utilized to reduce the radiation dose to as low as reasonably achievable. COMPARISON: None available. CLINICAL HISTORY: Neck trauma (Age >= 65y). Neck trauma. Age: >=65 years. FINDINGS: BONES AND ALIGNMENT: There is straightening of the normal cervical lordosis and subtle dextrocurvature of the cervical spine. No evidence of traumatic malalignment. No acute fracture. DEGENERATIVE CHANGES: Moderate disc space narrowing at C5-C6 and C6-C7. There is a disc osteophyte complex at C5-C6 eccentric to the right resulting in mild spinal canal stenosis. Facet arthrosis and uncovertebral hypertrophy at multiple levels. Foraminal stenosis is  greatest at C5-C6. SOFT TISSUES: No prevertebral soft tissue swelling. Atherosclerosis at the carotid bifurcations. Enlargement of the right thyroid lobe with prominent posterior extension which abuts the lateral aspect of the esophagus. There is possible nodularity within the right thyroid lobe. Consider correlation with nonemergent thyroid ultrasound. LUNG APICES: Emphysema within the lung apices. IMPRESSION: 1. No acute fracture or traumatic malalignment. 2. Enlargement of the right thyroid lobe with prominent posterior extension abutting the lateral aspect of the esophagus. Possible nodularity of the right thyroid lobe. Recommend nonemergent thyroid ultrasound. 3. Emphysema. Electronically signed by: Donnice Mania MD 11/09/2024 09:07 PM EST RP Workstation: [id number]   CT Head Wo Contrast Result Date: 11/09/2024 EXAM: CT HEAD WITHOUT CONTRAST 11/09/2024 08:52:51 PM TECHNIQUE: CT of the head was performed without the administration of intravenous contrast. Automated exposure control, iterative reconstruction, and/or weight based adjustment of the mA/kV was utilized to reduce the radiation dose to as low as reasonably achievable. COMPARISON: 08/16/2024 and 03/28/2024. CLINICAL HISTORY: Head trauma, moderate-severe. FINDINGS: BRAIN AND VENTRICLES:  No acute hemorrhage. No evidence of acute infarct. 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 There are extensive chronic microvascular ischemic changes similar to prior remote lacunar infarct in the right basal ganglia. Additional remote infarct in the left corona radiata extending into the centrum semiovale. Generalized parenchymal volume loss. Atherosclerosis of the carotid siphons. No hydrocephalus. No extra-axial collection. No mass effect or midline shift. SINUSES: No acute abnormality. SOFT TISSUES AND SKULL: No acute soft tissue abnormality. No skull fracture. IMPRESSION: 1. No acute intracranial abnormality. 2. 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 Electronically signed by: Donnice Mania MD 11/09/2024 09:01 PM EST RP Workstation: [id number]     Procedures   Medications Ordered in the ED - No data to display  Clinical Course as of 11/09/24 2313  Fri Nov 09, 2024  [id number] 68 year old female with a reportedly early history of dementia presenting to ED with a fall at home, reports that she tripped over something and fell backward struck her head.  She is on Eliquis .  Questionable near syncope preceding this.  Glucose normal for EMS.  Blood pressure mildly elevated.  Patient appears very calm and cooperative on exam.  Pending trauma imaging and labs. [MT]    Clinical Course User Index [MT] Trifan, Donnice PARAS, MD                                 Medical Decision Making Amount and/or Complexity of Data Reviewed Labs: ordered. Radiology: ordered.   This patient presents to the ED for concern of fall, this involves an extensive number of treatment options, and is a complaint that carries with it a high risk of complications and morbidity.  The differential diagnosis includes SAH, concussion, head injury, dementia, UTI   Co morbidities that complicate the patient  evaluation  Hypertension, stroke history, dementia.   Additional history obtained:  Additional history obtained from chart review   Lab Tests:  I Ordered, and personally interpreted labs.  The pertinent results include: CBC unremarkable, CMP with mild hyperglycemia, UA with concern for possible infection with bacteria and leukocytes seen.  Urine culture pending.   Imaging Studies ordered:  I ordered imaging studies including CT head, CT cervical spine I independently visualized and interpreted imaging which showed negative for any acute findings I agree with the radiologist interpretation  Consultations Obtained:  I requested consultation with none,  and discussed lab and imaging findings as well as pertinent plan - they recommend: N/A   Problem List / ED Course / Critical interventions / Medication management  Patient presents to the ED as a level 2 trauma for concerns of a fall. She takes Eliquis  for atrial fibrillation. Reportedly tripped over an object at home. Denies feeling dizzy or lightheaded prior to this fall. No family at bedside for collateral information. Patient seems like an otherwise reliable historian. Aox3. Exam is reassuring with minimal tenderness to the right posterior scalp with no lesion, hematoma, or other injury seen. No abnormal heart or lung sounds. She is well appearing. Labs are largely reassuring but UA concerning for possible infection.  CT head and cervical spine negative for acute findings.  Will manage UTI given previous history of complications from UTI.  Urine culture added on.  Spoke with patient's family which confirmed no obvious signs of presyncope or other concerning symptoms prior to her fall.  They were concerned that she felt somewhat lightheaded after the fall and was possibly more disoriented from baseline.  Cephalexin  to pharmacy.  Return precautions were discussed.  Otherwise stable for outpatient follow-up and discharged home. I have  reviewed the patients home medicines and have made adjustments as needed   Social Determinants of Health:  Dementia   Test / Admission - Considered:  Stable for outpatient follow-up.  Final diagnoses:  Fall, initial encounter  Acute cystitis without hematuria    ED Discharge Orders          Ordered    cephALEXin  (KEFLEX ) 500 MG capsule  4 times daily        11/09/24 2215               Cecily Legrand LABOR, PA-C 11/09/24 2313  "

## 2024-11-09 NOTE — Discharge Instructions (Signed)
 You were seen in the ER today for concerns of a fall. Your labs and imaging were thankfully reassuring without any obvious findings seen such as head bleed or fractures. Your urine appears to be developing infection so I have started treatment as this time for the next five days. Follow up closely with your primary care provider for further evaluation and assessment. Return to the ER for any concerns of new or worsening symptoms.

## 2024-11-09 NOTE — Progress Notes (Signed)
 Orthopedic Tech Progress Note Patient Details:  MYLO DRISKILL 1956/09/17 [id number]  Level II trauma, no ortho tech orders at this time.  Patient ID: KEYARI KLEEMAN, female   DOB: Feb 27, 1956, 69 y.o.   MRN: [id number]  Tinnie Ronal Brasil 11/09/2024, 8:13 PM

## 2024-11-09 NOTE — ED Notes (Signed)
 Patient is A&Ox3 at baseline upon discharge. Patient's family verbalized understanding of prescriptions, discharge instructions and follow-up care. Patient ambulatory from ED with steady gait.

## 2024-11-09 NOTE — ED Triage Notes (Signed)
 Pt BIB [facility] coming from home for witnessed fall. Family reports pt fell straight back and hit head. On Eliquis . No LOC. On arrival, patient AO x3 at baseline, pupils equal, and no dizziness reported. Hx of dementia--time  EMS: 174/82 100 HR 130 CBG
